# Patient Record
Sex: Female | Born: 1985 | Race: White | Hispanic: No | Marital: Single | Smoking: Current every day smoker
Health system: Southern US, Community
[De-identification: ages and names within clinical notes are randomized; demographics above are authoritative.]

## PROBLEM LIST (undated history)

## (undated) DIAGNOSIS — Z8489 Family history of other specified conditions: Secondary | ICD-10-CM

## (undated) DIAGNOSIS — F1290 Cannabis use, unspecified, uncomplicated: Secondary | ICD-10-CM

## (undated) DIAGNOSIS — I10 Essential (primary) hypertension: Secondary | ICD-10-CM

## (undated) DIAGNOSIS — D649 Anemia, unspecified: Secondary | ICD-10-CM

## (undated) DIAGNOSIS — R030 Elevated blood-pressure reading, without diagnosis of hypertension: Secondary | ICD-10-CM

## (undated) DIAGNOSIS — J45909 Unspecified asthma, uncomplicated: Secondary | ICD-10-CM

## (undated) DIAGNOSIS — R519 Headache, unspecified: Secondary | ICD-10-CM

## (undated) DIAGNOSIS — F17200 Nicotine dependence, unspecified, uncomplicated: Secondary | ICD-10-CM

## (undated) DIAGNOSIS — K219 Gastro-esophageal reflux disease without esophagitis: Secondary | ICD-10-CM

## (undated) DIAGNOSIS — Q430 Meckel's diverticulum (displaced) (hypertrophic): Secondary | ICD-10-CM

## (undated) HISTORY — PX: WISDOM TOOTH EXTRACTION: SHX21

---

## 2006-10-21 ENCOUNTER — Emergency Department: Payer: Self-pay | Admitting: Emergency Medicine

## 2007-10-22 ENCOUNTER — Inpatient Hospital Stay: Payer: Self-pay

## 2008-09-09 ENCOUNTER — Ambulatory Visit: Payer: Self-pay | Admitting: Internal Medicine

## 2011-06-22 ENCOUNTER — Ambulatory Visit: Payer: Self-pay | Admitting: Internal Medicine

## 2014-02-22 ENCOUNTER — Ambulatory Visit: Payer: Self-pay

## 2014-02-22 LAB — COMPREHENSIVE METABOLIC PANEL
ALBUMIN: 3.7 g/dL (ref 3.4–5.0)
AST: 14 U/L — AB (ref 15–37)
Alkaline Phosphatase: 110 U/L
Anion Gap: 12 (ref 7–16)
BILIRUBIN TOTAL: 0.2 mg/dL (ref 0.2–1.0)
BUN: 7 mg/dL (ref 7–18)
CALCIUM: 8.9 mg/dL (ref 8.5–10.1)
Chloride: 106 mmol/L (ref 98–107)
Co2: 25 mmol/L (ref 21–32)
Creatinine: 0.83 mg/dL (ref 0.60–1.30)
EGFR (Non-African Amer.): >60
Glucose: 89 mg/dL (ref 65–99)
Osmolality: 282 (ref 275–301)
Potassium: 3.6 mmol/L (ref 3.5–5.1)
SGPT (ALT): 23 U/L (ref 12–78)
Sodium: 143 mmol/L (ref 136–145)
Total Protein: 7.3 g/dL (ref 6.4–8.2)

## 2014-02-22 LAB — CBC WITH DIFFERENTIAL/PLATELET
BASOS ABS: 0.1 10*3/uL (ref 0.0–0.1)
Basophil %: 1.3 %
EOS ABS: 0.1 10*3/uL (ref 0.0–0.7)
Eosinophil %: 0.9 %
HCT: 34.2 % — ABNORMAL LOW (ref 35.0–47.0)
HGB: 10.4 g/dL — AB (ref 12.0–16.0)
LYMPHS ABS: 2.1 10*3/uL (ref 1.0–3.6)
Lymphocyte %: 20.9 %
MCH: 22.4 pg — ABNORMAL LOW (ref 26.0–34.0)
MCHC: 30.3 g/dL — AB (ref 32.0–36.0)
MCV: 74 fL — ABNORMAL LOW (ref 80–100)
Monocyte #: 0.4 x10 3/mm (ref 0.2–0.9)
Monocyte %: 4.2 %
NEUTROS ABS: 7.2 10*3/uL — AB (ref 1.4–6.5)
Neutrophil %: 72.7 %
Platelet: 630 10*3/uL — ABNORMAL HIGH (ref 150–440)
RBC: 4.62 10*6/uL (ref 3.80–5.20)
RDW: 17.6 % — ABNORMAL HIGH (ref 11.5–14.5)
WBC: 9.9 10*3/uL (ref 3.6–11.0)

## 2014-02-22 LAB — URINALYSIS, COMPLETE
BACTERIA: NEGATIVE
Bilirubin,UR: NEGATIVE
GLUCOSE, UR: NEGATIVE mg/dL (ref 0–75)
KETONE: NEGATIVE
Nitrite: NEGATIVE
Ph: 6 (ref 4.5–8.0)
Protein: NEGATIVE
Specific Gravity: 1.01 (ref 1.003–1.030)

## 2014-02-22 LAB — LIPASE, BLOOD: Lipase: 118 U/L (ref 73–393)

## 2014-02-22 LAB — PREGNANCY, URINE: Pregnancy Test, Urine: NEGATIVE m[IU]/mL

## 2014-02-22 LAB — AMYLASE: Amylase: 32 U/L (ref 25–115)

## 2014-02-25 ENCOUNTER — Ambulatory Visit: Payer: Self-pay

## 2014-02-25 LAB — URINE CULTURE

## 2015-02-11 ENCOUNTER — Emergency Department: Payer: Medicaid Other

## 2015-02-11 ENCOUNTER — Emergency Department
Admission: EM | Admit: 2015-02-11 | Discharge: 2015-02-11 | Disposition: A | Discharge status: Home / Self Care | Payer: Medicaid Other | Attending: Emergency Medicine | Admitting: Emergency Medicine

## 2015-02-11 ENCOUNTER — Encounter: Payer: Self-pay | Admitting: Emergency Medicine

## 2015-02-11 DIAGNOSIS — I10 Essential (primary) hypertension: Secondary | ICD-10-CM | POA: Insufficient documentation

## 2015-02-11 DIAGNOSIS — Z720 Tobacco use: Secondary | ICD-10-CM | POA: Insufficient documentation

## 2015-02-11 DIAGNOSIS — R079 Chest pain, unspecified: Secondary | ICD-10-CM | POA: Insufficient documentation

## 2015-02-11 DIAGNOSIS — K224 Dyskinesia of esophagus: Secondary | ICD-10-CM | POA: Diagnosis not present

## 2015-02-11 HISTORY — DX: Unspecified asthma, uncomplicated: J45.909

## 2015-02-11 HISTORY — DX: Essential (primary) hypertension: I10

## 2015-02-11 LAB — BASIC METABOLIC PANEL
ANION GAP: 5 (ref 5–15)
BUN: 9 mg/dL (ref 6–20)
CALCIUM: 9.1 mg/dL (ref 8.9–10.3)
CO2: 28 mmol/L (ref 22–32)
CREATININE: 0.71 mg/dL (ref 0.44–1.00)
Chloride: 107 mmol/L (ref 101–111)
GFR calc Af Amer: >60 mL/min (ref >60)
GFR calc non Af Amer: >60 mL/min (ref >60)
Glucose, Bld: 89 mg/dL (ref 65–99)
Potassium: 3.7 mmol/L (ref 3.5–5.1)
Sodium: 140 mmol/L (ref 135–145)

## 2015-02-11 LAB — CBC
HEMATOCRIT: 29.9 % — AB (ref 35.0–47.0)
HEMOGLOBIN: 9.1 g/dL — AB (ref 12.0–16.0)
MCH: 21.3 pg — ABNORMAL LOW (ref 26.0–34.0)
MCHC: 30.3 g/dL — ABNORMAL LOW (ref 32.0–36.0)
MCV: 70.4 fL — ABNORMAL LOW (ref 80.0–100.0)
Platelets: 572 10*3/uL — ABNORMAL HIGH (ref 150–440)
RBC: 4.25 MIL/uL (ref 3.80–5.20)
RDW: 17.5 % — ABNORMAL HIGH (ref 11.5–14.5)
WBC: 7.7 10*3/uL (ref 3.6–11.0)

## 2015-02-11 LAB — TROPONIN I

## 2015-02-11 MED ORDER — DICYCLOMINE HCL 20 MG PO TABS
ORAL_TABLET | ORAL | Status: AC
  Administered 2015-02-11: 20 mg via ORAL
  Filled 2015-02-11: qty 1

## 2015-02-11 MED ORDER — DICYCLOMINE HCL 20 MG PO TABS: 20.0000 mg | ORAL_TABLET | Freq: Four times a day (QID) | ORAL | Status: DC | PRN

## 2015-02-11 MED ORDER — DICYCLOMINE HCL 20 MG PO TABS
20.0000 mg | ORAL_TABLET | Freq: Once | ORAL | Status: AC
  Administered 2015-02-11: 20 mg via ORAL

## 2015-02-11 NOTE — ED Notes (Signed)
Pt reports that she has had mid sternal chest pain that has been coming and going for the last 4 days. She states that when she eats or takes a deep breath in she feels a catch. She denies any SOB, diaphoresis or N/V

## 2015-02-11 NOTE — Discharge Instructions (Signed)
1. Take Bentyl as needed for discomfort (#20). 2. Return to the ER for worsening symptoms, persistent vomiting, difficulty breathing or other concerns.  Chest Pain (Nonspecific) It is often hard to give a specific diagnosis for the cause of chest pain. There is always a chance that your pain could be related to something serious, such as a heart attack or a blood clot in the lungs. You need to follow up with your health care provider for further evaluation. CAUSES   Heartburn.  Pneumonia or bronchitis.  Anxiety or stress.  Inflammation around your heart (pericarditis) or lung (pleuritis or pleurisy).  A blood clot in the lung.  A collapsed lung (pneumothorax). It can develop suddenly on its own (spontaneous pneumothorax) or from trauma to the chest.  Shingles infection (herpes zoster virus). The chest wall is composed of bones, muscles, and cartilage. Any of these can be the source of the pain.  The bones can be bruised by injury.  The muscles or cartilage can be strained by coughing or overwork.  The cartilage can be affected by inflammation and become sore (costochondritis). DIAGNOSIS  Lab tests or other studies may be needed to find the cause of your pain. Your health care provider may have you take a test called an ambulatory electrocardiogram (ECG). An ECG records your heartbeat patterns over a 24-hour period. You may also have other tests, such as:  Transthoracic echocardiogram (TTE). During echocardiography, sound waves are used to evaluate how blood flows through your heart.  Transesophageal echocardiogram (TEE).  Cardiac monitoring. This allows your health care provider to monitor your heart rate and rhythm in real time.  Holter monitor. This is a portable device that records your heartbeat and can help diagnose heart arrhythmias. It allows your health care provider to track your heart activity for several days, if needed.  Stress tests by exercise or by giving medicine  that makes the heart beat faster. TREATMENT   Treatment depends on what may be causing your chest pain. Treatment may include:  Acid blockers for heartburn.  Anti-inflammatory medicine.  Pain medicine for inflammatory conditions.  Antibiotics if an infection is present.  You may be advised to change lifestyle habits. This includes stopping smoking and avoiding alcohol, caffeine, and chocolate.  You may be advised to keep your head raised (elevated) when sleeping. This reduces the chance of acid going backward from your stomach into your esophagus. Most of the time, nonspecific chest pain will improve within 2-3 days with rest and mild pain medicine.  HOME CARE INSTRUCTIONS   If antibiotics were prescribed, take them as directed. Finish them even if you start to feel better.  For the next few days, avoid physical activities that bring on chest pain. Continue physical activities as directed.  Do not use any tobacco products, including cigarettes, chewing tobacco, or electronic cigarettes.  Avoid drinking alcohol.  Only take medicine as directed by your health care provider.  Follow your health care provider's suggestions for further testing if your chest pain does not go away.  Keep any follow-up appointments you made. If you do not go to an appointment, you could develop lasting (chronic) problems with pain. If there is any problem keeping an appointment, call to reschedule. SEEK MEDICAL CARE IF:   Your chest pain does not go away, even after treatment.  You have a rash with blisters on your chest.  You have a fever. SEEK IMMEDIATE MEDICAL CARE IF:   You have increased chest pain or pain that  spreads to your arm, neck, jaw, back, or abdomen.  You have shortness of breath.  You have an increasing cough, or you cough up blood.  You have severe back or abdominal pain.  You feel nauseous or vomit.  You have severe weakness.  You faint.  You have chills. This is an  emergency. Do not wait to see if the pain will go away. Get medical help at once. Call your local emergency services (911 in [place] Do not drive yourself to the hospital. MAKE SURE YOU:   Understand these instructions.  Will watch your condition.  Will get help right away if you are not doing well or get worse. Document Released: 05/30/2005 Document Revised: 08/25/2013 Document Reviewed: 03/25/2008 [facility] Patient Information 2015 [place], [place]. This information is not intended to replace advice given to you by your health care provider. Make sure you discuss any questions you have with your health care provider.  Esophageal Spasm Esophageal spasm is an uncoordinated contraction of the muscles of the esophagus (the tube which carries food from your mouth to your stomach). Normally, the muscles of the esophagus alternate between contraction and relaxation starting from the top of the esophagus and working down to the bottom. This moves the food from the mouth to the stomach. In esophageal spasm, all the muscles contract at once. This causes pain and fails to move the food along. As a result, you may have trouble swallowing.  Women are more likely than men to have esophageal spasm. The cause of the spasms is not known. Sometimes eating hot or cold foods triggers the condition and this may be due to an overly sensitive esophagus. This is not an infectious disease and cannot be passed to others. SYMPTOMS  Symptoms of esophageal spasm may include: chest pain, burning or pain with swallowing, and difficulty swallowing.  DIAGNOSIS  Esophageal spasm can be diagnosed by a test called manometry (pressure studies of the esophagus). In this test, a special tube is inserted down the esophagus. The tube measures the muscle activity of the esophagus. Abnormal contractions mixed with normal movement helps confirm the diagnosis.  A person with a hypersensitive esophagus may be diagnosed by inflating a long  balloon in the person's esophagus. If this causes the same symptoms, preventive methods may work. PREVENTION  Avoid hot or cold foods if that seems to be a trigger. PROGNOSIS  This condition does not go away, nor is treatment entirely satisfactory. Patients need to be careful of what they eat. They need to continue on medication if a useful one is found. Fortunately, the condition does not get progressively worse as time passes. Esophageal spasm does not usually lead to more serious problems but sometimes the pain can be disabling. If a person becomes afraid to eat they may become malnourished and lose weight.  TREATMENT   A procedure in which instruments of increasing size are inserted through the esophagus to enlarge (dilate) it are used.  Medications that decrease acid-production of the stomach may be used such as proton-pump inhibitors or H2-blockers.  Medications of several types can be used to relax the muscles of the esophagus.  An individual with a hypersensitive esophagus sometimes improves with low doses of medications normally used for depression.  No treatment for esophageal spasm is effective for everyone. Often several approaches will be tried before one works. In many cases, the symptoms will improve, but will not go away completely.  For severe cases, relief is obtained two-thirds of the time by  cutting the muscles along the entire length of the esophagus. This is a major surgical procedure.  Your symptoms are usually the best guide to how well the treatment for esophageal spasm works. SIDE EFFECTS OF TREATMENTS  Nitrates can cause headaches and low blood pressure.  Calcium channel blockers can cause:  Feeling sick to your stomach (nausea).  Constipation and other side effects.  Antidepressants can cause side effects that depend on the medication used. HOME CARE INSTRUCTIONS   Let your caregiver know if problems are getting worse, or if you get food stuck in your  esophagus for longer than 1 hour or as directed and are unable to swallow liquid.  Take medications as directed and with permission of your caregiver. Ask about what to do if a medication seems to get stuck in your esophagus. Only take over-the-counter or prescription medicines for pain, discomfort, or fever as directed by your caregiver.  Soft and liquid foods pass more easily than solid pieces. SEEK IMMEDIATE MEDICAL CARE IF:   You develop severe chest pain, especially if the pain is crushing or pressure-like and spreads to the arms, back, neck, or jaw, or if you have sweating, nausea, or shortness of breath. THIS COULD BE AN EMERGENCY. Do not wait to see if the pain will go away. Get medical help at once. Call 911 or 0 (operator). DO NOT drive yourself to the hospital.  Your chest pain gets worse and does not go away with rest.  You have an attack of chest pain lasting longer than usual despite rest and treatment with the medications your physician has prescribed.  You wake from sleep with chest pain or shortness of breath.  You feel dizzy or faint.  You have chest pain, not typical of your usual pain, caused by your esophagus for which you originally saw your caregiver. MAKE SURE YOU:   Understand these instructions.  Will watch your condition.  Will get help right away if you are not doing well or get worse. Document Released: 11/10/2002 Document Revised: 11/12/2011 Document Reviewed: 11/13/2013 [facility] Patient Information 2015 [place], [place]. This information is not intended to replace advice given to you by your health care provider. Make sure you discuss any questions you have with your health care provider.

## 2015-02-11 NOTE — ED Notes (Signed)
Patient with no complaints at this time. Respirations even and unlabored. Skin warm/dry. Discharge instructions reviewed with patient at this time. Patient given opportunity to voice concerns/ask questions. Patient discharged at this time and left Emergency Department with steady gait.   

## 2015-02-11 NOTE — ED Provider Notes (Signed)
[facility] Emergency Department Provider Note  ____________________________________________  Time seen: Approximately 4:53 AM  I have reviewed the triage vital signs and the nursing notes.   HISTORY  Chief Complaint Chest Pain    HPI Kelli Morrison is a 29 y.o. female presents with a 4 day history of midsternal chest pain associated only with eating. Patient states she experiences similar symptoms with both solids and liquids. Describes swallowing food or liquid, feeling it "stick" in the middle of her chest, then sharp pain for a few seconds until the feeling subsides. States sometimes the feeling feels like burning but mostly just feels like pain. Patient does not have this pain outside of eating.Patient denies fever, chills, cough, shortness of breath, nausea, diaphoresis, headache, weakness.   Past Medical History  Diagnosis Date  . Hypertension   . Asthma     There are no active problems to display for this patient.   Past Surgical History  Procedure Laterality Date  . Wisdom tooth extraction    . Cesarean section      No current outpatient prescriptions on file.  Allergies Review of patient's allergies indicates no known allergies.  History reviewed. No pertinent family history.  Social History History  Substance Use Topics  . Smoking status: Current Every Day Smoker  . Smokeless tobacco: Not on file  . Alcohol Use: No    Review of Systems Constitutional: No fever/chills Eyes: No visual changes. ENT: No sore throat. Cardiovascular: Positive for chest pain. Respiratory: Denies shortness of breath. Gastrointestinal: No abdominal pain.  No nausea, no vomiting.  No diarrhea.  No constipation. Genitourinary: Negative for dysuria. Musculoskeletal: Negative for back pain. Skin: Negative for rash. Neurological: Negative for headaches, focal weakness or numbness.  10-point ROS otherwise  negative.  ____________________________________________   PHYSICAL EXAM:  VITAL SIGNS: ED Triage Vitals  Enc Vitals Group     BP 02/11/15 0109 143/90 mmHg     Pulse Rate 02/11/15 0109 90     Resp 02/11/15 0109 18     Temp 02/11/15 0109 97.6 F (36.4 C)     Temp Source 02/11/15 0109 Oral     SpO2 02/11/15 0109 100 %     Weight 02/11/15 0109 185 lb (83.915 kg)     Height 02/11/15 0109 5\' 7"  (1.702 m)     Head Cir --      Peak Flow --      Pain Score 02/11/15 0109 8     Pain Loc --      Pain Edu? --      Excl. in [place]? --     Constitutional: Alert and oriented. Well appearing and in no acute distress. Eyes: Conjunctivae are normal. PERRL. EOMI. Head: Atraumatic. Nose: No congestion/rhinnorhea. Mouth/Throat: Mucous membranes are moist.  Oropharynx non-erythematous. Neck: No stridor.   Cardiovascular: Normal rate, regular rhythm. Grossly normal heart sounds.  Good peripheral circulation. Respiratory: Normal respiratory effort.  No retractions. Lungs CTAB. Gastrointestinal: Soft and nontender. No distention. No abdominal bruits. No CVA tenderness. Musculoskeletal: No lower extremity tenderness nor edema.  No joint effusions. Neurologic:  Normal speech and language. No gross focal neurologic deficits are appreciated. Speech is normal. No gait instability. Skin:  Skin is warm, dry and intact. No rash noted. Psychiatric: Mood and affect are normal. Speech and behavior are normal.  ____________________________________________   LABS (all labs ordered are listed, but only abnormal results are displayed)  Labs Reviewed  CBC - Abnormal; Notable for the following:  Hemoglobin 9.1 (*)    HCT 29.9 (*)    MCV 70.4 (*)    MCH 21.3 (*)    MCHC 30.3 (*)    RDW 17.5 (*)    Platelets 572 (*)    All other components within normal limits  BASIC METABOLIC PANEL  TROPONIN I   ____________________________________________  EKG  ED ECG REPORT I, Kama Cammarano J, the attending  physician, personally viewed and interpreted this ECG.   Date: 02/11/2015  EKG Time: 0116  Rate: 78  Rhythm: normal EKG, normal sinus rhythm  Axis: Normal  Intervals:none  ST&T Change: Nonspecific  ____________________________________________  RADIOLOGY  Chest 2 view (view by me, interpreted by Dr. Alroy Dust): No active cardiopulmonary disease. ____________________________________________   PROCEDURES  Procedure(s) performed: None  Critical Care performed: No  ____________________________________________   INITIAL IMPRESSION / ASSESSMENT AND PLAN / ED COURSE  Pertinent labs & imaging results that were available during my care of the patient were reviewed by me and considered in my medical decision making (see chart for details).  29 year old female who presents with a 4 day history of pain in center of chest associated with eating solids and liquids. No recent travel history or oral contraception use to suggest pulmonary embolism. No suspicion for acute coronary syndrome. Symptoms more likely consistent with esophageal spasms. Discussed with patient; do not feel comfortable starting a young patient on nitroglycerin for esophageal spasms, will instead start Bentyl for anti-spasmodic and refer to GI for follow-up. Strict return precautions given. Patient verbalizes understanding and agrees with plan of care. ____________________________________________   FINAL CLINICAL IMPRESSION(S) / ED DIAGNOSES  Final diagnoses:  Chest pain, unspecified chest pain type  Esophageal spasm      Paulette Blanch, MD 02/11/15 [phone]

## 2016-12-21 ENCOUNTER — Encounter: Payer: Self-pay | Admitting: Emergency Medicine

## 2016-12-21 ENCOUNTER — Emergency Department: Payer: Medicaid Other

## 2016-12-21 ENCOUNTER — Emergency Department
Admission: EM | Admit: 2016-12-21 | Discharge: 2016-12-21 | Disposition: A | Discharge status: Home / Self Care | Payer: Medicaid Other | Attending: Emergency Medicine | Admitting: Emergency Medicine

## 2016-12-21 DIAGNOSIS — Y999 Unspecified external cause status: Secondary | ICD-10-CM | POA: Diagnosis not present

## 2016-12-21 DIAGNOSIS — J45909 Unspecified asthma, uncomplicated: Secondary | ICD-10-CM | POA: Diagnosis not present

## 2016-12-21 DIAGNOSIS — Y939 Activity, unspecified: Secondary | ICD-10-CM | POA: Diagnosis not present

## 2016-12-21 DIAGNOSIS — Y929 Unspecified place or not applicable: Secondary | ICD-10-CM | POA: Diagnosis not present

## 2016-12-21 DIAGNOSIS — S93402A Sprain of unspecified ligament of left ankle, initial encounter: Secondary | ICD-10-CM | POA: Insufficient documentation

## 2016-12-21 DIAGNOSIS — I10 Essential (primary) hypertension: Secondary | ICD-10-CM | POA: Insufficient documentation

## 2016-12-21 DIAGNOSIS — F17200 Nicotine dependence, unspecified, uncomplicated: Secondary | ICD-10-CM | POA: Diagnosis not present

## 2016-12-21 DIAGNOSIS — S99912A Unspecified injury of left ankle, initial encounter: Secondary | ICD-10-CM | POA: Diagnosis present

## 2016-12-21 DIAGNOSIS — X501XXA Overexertion from prolonged static or awkward postures, initial encounter: Secondary | ICD-10-CM | POA: Insufficient documentation

## 2016-12-21 MED ORDER — TRAMADOL HCL 50 MG PO TABS: 50.0000 mg | ORAL_TABLET | Freq: Two times a day (BID) | ORAL | 0 refills | Status: DC | PRN

## 2016-12-21 MED ORDER — IBUPROFEN 600 MG PO TABS: 600.0000 mg | ORAL_TABLET | Freq: Three times a day (TID) | ORAL | 0 refills | Status: DC | PRN

## 2016-12-21 NOTE — ED Provider Notes (Signed)
[facility] Emergency Department Provider Note   ____________________________________________   First MD Initiated Contact with Patient 12/21/16 1343     (approximate)  I have reviewed the triage vital signs and the nursing notes.   HISTORY  Chief Complaint Ankle Pain    HPI Kelli Morrison is a 31 y.o. female Patient complain of left lateral ankle pain secondary to twisting incident last night.  Rates pain as 5/10. Describes pain as "achy". No palliative measures for compliant.   Past Medical History:  Diagnosis Date  . Asthma   . Hypertension     There are no active problems to display for this patient.   Past Surgical History:  Procedure Laterality Date  . CESAREAN SECTION    . WISDOM TOOTH EXTRACTION      Prior to Admission medications   Medication Sig Start Date End Date Taking? Authorizing Provider  dicyclomine (BENTYL) 20 MG tablet Take 1 tablet (20 mg total) by mouth every 6 (six) hours as needed. 02/11/15   Paulette Blanch, MD  ibuprofen (ADVIL,MOTRIN) 600 MG tablet Take 1 tablet (600 mg total) by mouth every 8 (eight) hours as needed. 12/21/16   Sable Feil, PA-C  traMADol (ULTRAM) 50 MG tablet Take 1 tablet (50 mg total) by mouth every 12 (twelve) hours as needed. 12/21/16   Sable Feil, PA-C    Allergies Patient has no known allergies.  No family history on file.  Social History Social History  Substance Use Topics  . Smoking status: Current Every Day Smoker  . Smokeless tobacco: Not on file  . Alcohol use No    Review of Systems Constitutional: No fever/chills Eyes: No visual changes. ENT: No sore throat. Cardiovascular: Denies chest pain. Respiratory: Denies shortness of breath. Gastrointestinal: No abdominal pain.  No nausea, no vomiting.  No diarrhea.  No constipation. Genitourinary: Negative for dysuria. Musculoskeletal: Left ankle pain Skin: Negative for rash. Neurological: Negative for headaches, focal  weakness or numbness. Endocrine:Hypertension ____________________________________________   PHYSICAL EXAM:  VITAL SIGNS: ED Triage Vitals [12/21/16 1339]  Enc Vitals Group     BP 130/88     Pulse Rate (!) 102     Resp 16     Temp 97.6 F (36.4 C)     Temp Source Oral     SpO2 98 %     Weight 225 lb (102.1 kg)     Height 5\' 7"  (1.702 m)     Head Circumference      Peak Flow      Pain Score 5     Pain Loc      Pain Edu?      Excl. in [place]?     Constitutional: Alert and oriented. Well appearing and in no acute distress. Eyes: Conjunctivae are normal. PERRL. EOMI. Head: Atraumatic. Nose: No congestion/rhinnorhea. Mouth/Throat: Mucous membranes are moist.  Oropharynx non-erythematous. Neck: No stridor.  No cervical spine tenderness to palpation. Hematological/Lymphatic/Immunilogical: No cervical lymphadenopathy. Cardiovascular: Normal rate, regular rhythm. Grossly normal heart sounds.  Good peripheral circulation. Respiratory: Normal respiratory effort.  No retractions. Lungs CTAB. Gastrointestinal: Soft and nontender. No distention. No abdominal bruits. No CVA tenderness. Musculoskeletal: No obvious deformity. Moderate edema lateral mallelous. Neurologic:  Normal speech and language. No gross focal neurologic deficits are appreciated. No gait instability. Skin:  Skin is warm, dry and intact. No rash noted. Psychiatric: Mood and affect are normal. Speech and behavior are normal.  ____________________________________________   LABS (all labs ordered are  listed, but only abnormal results are displayed)  Labs Reviewed - No data to display ____________________________________________  EKG   ____________________________________________  RADIOLOGY   ____________________________________________   PROCEDURES  Procedure(s) performed: None  Procedures  Critical Care performed: No  ____________________________________________   INITIAL IMPRESSION / ASSESSMENT  AND PLAN / ED COURSE  Pertinent labs & imaging results that were available during my care of the patient were reviewed by me and considered in my medical decision making (see chart for details).  Left ankle sprain. X-ray pending.      ____________________________________________   FINAL CLINICAL IMPRESSION(S) / ED DIAGNOSES  Final diagnoses:  Sprain of left ankle, unspecified ligament, initial encounter   Patient given ankle splint and discharge instruction. Advised to follow up with Doctor Cleda Mccreedy  if compliant do not improved.   NEW MEDICATIONS STARTED DURING THIS VISIT:  New Prescriptions   IBUPROFEN (ADVIL,MOTRIN) 600 MG TABLET    Take 1 tablet (600 mg total) by mouth every 8 (eight) hours as needed.   TRAMADOL (ULTRAM) 50 MG TABLET    Take 1 tablet (50 mg total) by mouth every 12 (twelve) hours as needed.     Note:  This document was prepared using Dragon voice recognition software and may include unintentional dictation errors.    Sable Feil, PA-C 12/21/16 1414    Earleen Newport, MD 12/21/16 [phone]

## 2016-12-21 NOTE — ED Triage Notes (Signed)
Patient presents to ED via POV with c/o left ankle pain after rolling it last night.

## 2017-02-11 ENCOUNTER — Encounter: Payer: Self-pay | Admitting: Emergency Medicine

## 2017-02-11 DIAGNOSIS — F17200 Nicotine dependence, unspecified, uncomplicated: Secondary | ICD-10-CM | POA: Insufficient documentation

## 2017-02-11 DIAGNOSIS — N390 Urinary tract infection, site not specified: Secondary | ICD-10-CM | POA: Diagnosis not present

## 2017-02-11 DIAGNOSIS — Z79899 Other long term (current) drug therapy: Secondary | ICD-10-CM | POA: Diagnosis not present

## 2017-02-11 DIAGNOSIS — I10 Essential (primary) hypertension: Secondary | ICD-10-CM | POA: Diagnosis not present

## 2017-02-11 DIAGNOSIS — Z791 Long term (current) use of non-steroidal anti-inflammatories (NSAID): Secondary | ICD-10-CM | POA: Insufficient documentation

## 2017-02-11 DIAGNOSIS — N10 Acute tubulo-interstitial nephritis: Secondary | ICD-10-CM | POA: Diagnosis not present

## 2017-02-11 DIAGNOSIS — R109 Unspecified abdominal pain: Secondary | ICD-10-CM | POA: Diagnosis present

## 2017-02-11 DIAGNOSIS — J45909 Unspecified asthma, uncomplicated: Secondary | ICD-10-CM | POA: Insufficient documentation

## 2017-02-11 LAB — POCT PREGNANCY, URINE: PREG TEST UR: NEGATIVE

## 2017-02-11 NOTE — ED Triage Notes (Signed)
Pt ambulatory to triage with steady gait, no distress noted. Pt c/o of upper epigastric abdominal pain, lower back pain and nasal congestion since Thursday. Pts denies N/V/D.

## 2017-02-12 ENCOUNTER — Emergency Department
Admission: EM | Admit: 2017-02-12 | Discharge: 2017-02-12 | Disposition: A | Discharge status: Home / Self Care | Payer: Medicaid Other | Attending: Emergency Medicine | Admitting: Emergency Medicine

## 2017-02-12 ENCOUNTER — Encounter: Payer: Self-pay | Admitting: Radiology

## 2017-02-12 ENCOUNTER — Emergency Department: Payer: Medicaid Other

## 2017-02-12 DIAGNOSIS — N12 Tubulo-interstitial nephritis, not specified as acute or chronic: Secondary | ICD-10-CM

## 2017-02-12 DIAGNOSIS — N390 Urinary tract infection, site not specified: Secondary | ICD-10-CM

## 2017-02-12 DIAGNOSIS — R1084 Generalized abdominal pain: Secondary | ICD-10-CM

## 2017-02-12 LAB — URINALYSIS, COMPLETE (UACMP) WITH MICROSCOPIC
Bilirubin Urine: NEGATIVE
Glucose, UA: NEGATIVE mg/dL
Ketones, ur: NEGATIVE mg/dL
Nitrite: NEGATIVE
PH: 5 (ref 5.0–8.0)
Protein, ur: NEGATIVE mg/dL
SPECIFIC GRAVITY, URINE: 1.019 (ref 1.005–1.030)

## 2017-02-12 LAB — CBC
HCT: 40.6 % (ref 35.0–47.0)
Hemoglobin: 13.9 g/dL (ref 12.0–16.0)
MCH: 32.5 pg (ref 26.0–34.0)
MCHC: 34.2 g/dL (ref 32.0–36.0)
MCV: 95.2 fL (ref 80.0–100.0)
PLATELETS: 310 10*3/uL (ref 150–440)
RBC: 4.27 MIL/uL (ref 3.80–5.20)
RDW: 13.6 % (ref 11.5–14.5)
WBC: 15.1 10*3/uL — ABNORMAL HIGH (ref 3.6–11.0)

## 2017-02-12 LAB — COMPREHENSIVE METABOLIC PANEL
ALT: 19 U/L (ref 14–54)
AST: 24 U/L (ref 15–41)
Albumin: 3.7 g/dL (ref 3.5–5.0)
Alkaline Phosphatase: 107 U/L (ref 38–126)
Anion gap: 8 (ref 5–15)
BUN: 11 mg/dL (ref 6–20)
CHLORIDE: 105 mmol/L (ref 101–111)
CO2: 27 mmol/L (ref 22–32)
Calcium: 8.8 mg/dL — ABNORMAL LOW (ref 8.9–10.3)
Creatinine, Ser: 0.79 mg/dL (ref 0.44–1.00)
GFR calc Af Amer: >60 mL/min (ref >60)
GFR calc non Af Amer: >60 mL/min (ref >60)
Glucose, Bld: 84 mg/dL (ref 65–99)
Potassium: 3.8 mmol/L (ref 3.5–5.1)
SODIUM: 140 mmol/L (ref 135–145)
Total Bilirubin: 0.3 mg/dL (ref 0.3–1.2)
Total Protein: 7.1 g/dL (ref 6.5–8.1)

## 2017-02-12 LAB — LIPASE, BLOOD: LIPASE: 19 U/L (ref 11–51)

## 2017-02-12 LAB — TROPONIN I: Troponin I: <0.03 ng/mL (ref <0.03)

## 2017-02-12 MED ORDER — CEPHALEXIN 500 MG PO CAPS: 500.0000 mg | ORAL_CAPSULE | Freq: Three times a day (TID) | ORAL | 0 refills | Status: DC

## 2017-02-12 MED ORDER — HYDROCODONE-ACETAMINOPHEN 5-325 MG PO TABS: 1.0000 | ORAL_TABLET | Freq: Four times a day (QID) | ORAL | 0 refills | Status: DC | PRN

## 2017-02-12 MED ORDER — LIDOCAINE HCL (PF) 1 % IJ SOLN
2.0000 mL | Freq: Once | INTRAMUSCULAR | Status: AC
  Administered 2017-02-12: 2 mL

## 2017-02-12 MED ORDER — DEXTROSE 5 % IV SOLN: 1.0000 g | INTRAVENOUS | Status: DC

## 2017-02-12 MED ORDER — IOPAMIDOL (ISOVUE-300) INJECTION 61%
100.0000 mL | Freq: Once | INTRAVENOUS | Status: AC | PRN
  Administered 2017-02-12: 100 mL via INTRAVENOUS

## 2017-02-12 MED ORDER — CEFTRIAXONE SODIUM 1 G IJ SOLR
1.0000 g | Freq: Once | INTRAMUSCULAR | Status: AC
  Administered 2017-02-12: 1 g via INTRAMUSCULAR
  Filled 2017-02-12: qty 10

## 2017-02-12 MED ORDER — LIDOCAINE HCL (PF) 1 % IJ SOLN
INTRAMUSCULAR | Status: AC
  Administered 2017-02-12: 2 mL
  Filled 2017-02-12: qty 5

## 2017-02-12 MED ORDER — SODIUM CHLORIDE 0.9 % IV BOLUS (SEPSIS)
500.0000 mL | Freq: Once | INTRAVENOUS | Status: AC
  Administered 2017-02-12: 500 mL via INTRAVENOUS

## 2017-02-12 MED ORDER — IOPAMIDOL (ISOVUE-300) INJECTION 61%
30.0000 mL | Freq: Once | INTRAVENOUS | Status: AC | PRN
  Administered 2017-02-12: 30 mL via ORAL

## 2017-02-12 MED ORDER — ONDANSETRON 4 MG PO TBDP: 4.0000 mg | ORAL_TABLET | Freq: Three times a day (TID) | ORAL | 0 refills | Status: DC | PRN

## 2017-02-12 MED ORDER — ONDANSETRON HCL 4 MG/2ML IJ SOLN
4.0000 mg | Freq: Once | INTRAMUSCULAR | Status: AC
  Administered 2017-02-12: 4 mg via INTRAVENOUS
  Filled 2017-02-12: qty 2

## 2017-02-12 NOTE — ED Provider Notes (Signed)
[facility] Provider Note   ____________________________________________   First MD Initiated Contact with Patient 02/12/17 0211     (approximate)  I have reviewed the triage vital signs and the nursing notes.   HISTORY  Chief Complaint Abdominal Pain; Nasal Congestion; and Back Pain    HPI Kelli Morrison is a 31 y.o. female who presents to the ED from home with a chief complaint of abdominal pain, lower back pain and nasal congestion. Patient reports onset of lower back pain 4 days ago in her sleep, right greater than left. 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 Has also noted some nasal congestion.Main chief complaint is abdominal pain which started yesterday. Initially started in upper abdomen, now diffusely. Symptoms associated with nausea; no vomiting or diarrhea. Last bowel movement this morning which was normal for patient. Denies associated fever, chills, chest pain, shortness of breath, dysuria. Had her period over the weekend which lasted only 2 days which is shorter than usual. Denies vaginal discharge. Denies recent travel, trauma, camping or antibiotic use. Nothing makes her symptoms better or worse.   Past Medical History:  Diagnosis Date  . Asthma   . Hypertension     There are no active problems to display for this patient.   Past Surgical History:  Procedure Laterality Date  . CESAREAN SECTION    . WISDOM TOOTH EXTRACTION      Prior to Admission medications   Medication Sig Start Date End Date Taking? Authorizing Provider  cephALEXin (KEFLEX) 500 MG capsule Take 1 capsule (500 mg total) by mouth 3 (three) times daily. 02/12/17   Paulette Blanch, MD  dicyclomine (BENTYL) 20 MG tablet Take 1 tablet (20 mg total) by mouth every 6 (six) hours as needed. 02/11/15   Paulette Blanch, MD  HYDROcodone-acetaminophen (NORCO) 5-325 MG tablet Take 1 tablet by mouth every 6  (six) hours as needed for moderate pain. 02/12/17   Paulette Blanch, MD  ibuprofen (ADVIL,MOTRIN) 600 MG tablet Take 1 tablet (600 mg total) by mouth every 8 (eight) hours as needed. 12/21/16   Sable Feil, PA-C  ondansetron (ZOFRAN ODT) 4 MG disintegrating tablet Take 1 tablet (4 mg total) by mouth every 8 (eight) hours as needed for nausea or vomiting. 02/12/17   Paulette Blanch, MD  traMADol (ULTRAM) 50 MG tablet Take 1 tablet (50 mg total) by mouth every 12 (twelve) hours as needed. 12/21/16   Sable Feil, PA-C    Allergies Patient has no known allergies.  History reviewed. No pertinent family history.  Social History Social History  Substance Use Topics  . Smoking status: Current Every Day Smoker  . Smokeless tobacco: Never Used  . Alcohol use No    Review of Systems  Constitutional: No fever/chills. Eyes: No visual changes. ENT: Positive for nasal congestion. No sore throat. Cardiovascular: Denies chest pain. Respiratory: Denies shortness of breath. Gastrointestinal: Positive for abdominal pain and nausea, no vomiting.  No diarrhea.  No constipation. Genitourinary: Negative for dysuria. Musculoskeletal: Positive for back pain. Skin: Negative for rash. Neurological: Negative for headaches, focal weakness or numbness.   ____________________________________________   PHYSICAL EXAM:  VITAL SIGNS: ED Triage Vitals [02/11/17 2353]  Enc Vitals Group     BP 125/81     Pulse Rate 100     Resp 17     Temp 98.6 F (37 C)     Temp  Source Oral     SpO2 100 %     Weight      Height      Head Circumference      Peak Flow      Pain Score      Pain Loc      Pain Edu?      Excl. in [place]?     Constitutional: Alert and oriented. Well appearing and in no acute distress. Eyes: Conjunctivae are normal.  Head: Atraumatic. Nose: No congestion/rhinnorhea. Mouth/Throat: Mucous membranes are moist.  Oropharynx non-erythematous. Neck: No stridor.   Cardiovascular: Normal rate,  regular rhythm. Grossly normal heart sounds.  Good peripheral circulation. Respiratory: Normal respiratory effort.  No retractions. Lungs CTAB. Gastrointestinal: Soft with mild tenderness to palpation diffusely without rebound or guarding. No distention. No abdominal bruits. No CVA tenderness. Musculoskeletal: No lower extremity tenderness nor edema.  No joint effusions. Neurologic:  Normal speech and language. No gross focal neurologic deficits are appreciated. No gait instability. Skin:  Skin is warm, dry and intact. No rash noted. Psychiatric: Mood and affect are normal. Speech and behavior are normal.  ____________________________________________   LABS (all labs ordered are listed, but only abnormal results are displayed)  Labs Reviewed  COMPREHENSIVE METABOLIC PANEL - Abnormal; Notable for the following:       Result Value   Calcium 8.8 (*)    All other components within normal limits  CBC - Abnormal; Notable for the following:    WBC 15.1 (*)    All other components within normal limits  URINALYSIS, COMPLETE (UACMP) WITH MICROSCOPIC - Abnormal; Notable for the following:    Color, Urine YELLOW (*)    APPearance HAZY (*)    Hgb urine dipstick MODERATE (*)    Leukocytes, UA TRACE (*)    Bacteria, UA RARE (*)    Squamous Epithelial / LPF 0-5 (*)    All other components within normal limits  LIPASE, BLOOD  TROPONIN I  POCT PREGNANCY, URINE  POC URINE PREG, ED   ____________________________________________  EKG  None ____________________________________________  RADIOLOGY  Ct Abdomen Pelvis W Contrast  Result Date: 02/12/2017 CLINICAL DATA:  Upper epigastric abdominal pain EXAM: CT ABDOMEN AND PELVIS WITH CONTRAST TECHNIQUE: Multidetector CT imaging of the abdomen and pelvis was performed using the standard protocol following bolus administration of intravenous contrast. CONTRAST:  12mL ISOVUE-300 IOPAMIDOL (ISOVUE-300) INJECTION 61% COMPARISON:  None. FINDINGS: Lower  chest: Lung bases demonstrate no acute consolidation or effusion. Mild atelectasis at the left lung base. Normal heart size. Hepatobiliary: No focal liver abnormality is seen. No gallstones, gallbladder wall thickening, or biliary dilatation. Pancreas: Unremarkable. No pancreatic ductal dilatation or surrounding inflammatory changes. Spleen: Normal in size without focal abnormality. Adrenals/Urinary Tract: Adrenal glands are within normal limits. Left kidney unremarkable. Hazy edema and soft tissue stranding around the right kidney and renal pelvis. Bladder unremarkable. Stomach/Bowel: Stomach is within normal limits. Appendix appears normal. No evidence of bowel wall thickening, distention, or inflammatory changes. Probable pill fragments in the distal small bowel in the right pelvis. Vascular/Lymphatic: No significant vascular findings are present. No enlarged abdominal or pelvic lymph nodes. Reproductive: Uterus and bilateral adnexa are unremarkable. Other: No free air or free fluid.  Small fat in the umbilicus. Musculoskeletal: No acute or significant osseous findings. IMPRESSION: 1. Mild right perinephric edema and soft tissue stranding with patchy cortical hypodensity in the right midpole ; appearance is suspicious for acute pyelonephritis. Suggest correlation with urinalysis. 2. Normal appendix Electronically Signed  By: Donavan Foil M.D.   On: 02/12/2017 03:12    ____________________________________________   PROCEDURES  Procedure(s) performed: None  Procedures  Critical Care performed: No  ____________________________________________   INITIAL IMPRESSION / ASSESSMENT AND PLAN / ED COURSE  Pertinent labs & imaging results that were available during my care of the patient were reviewed by me and considered in my medical decision making (see chart for details).  31 year old female who presents with lower back pain and generalized abdominal pain. Laboratory urinalysis results remarkable  for mild leukocytosis, trace leukocytes. Will administer antiemetic and proceed to CT abdomen/pelvis to evaluate for intra-abdominal pathology.  Clinical Course as of Feb 13 548  Tue Feb 12, 2017  1610 Patient was updated of CT imaging results. Her IV infiltrated so IM Rocephin was administered. She was discharged home in stable condition with prescriptions for Keflex, Norco and Zofran. She was instructed to follow-up with her PCP closely this week. Strict return precautions were given. Patient verbalized understanding and agreed with plan of care.  [JS]    Clinical Course User Index [JS] Paulette Blanch, MD     ____________________________________________   FINAL CLINICAL IMPRESSION(S) / ED DIAGNOSES  Final diagnoses:  Generalized abdominal pain  Lower urinary tract infectious disease  Pyelonephritis      NEW MEDICATIONS STARTED DURING THIS VISIT:  Discharge Medication List as of 02/12/2017  3:39 AM    START taking these medications   Details  cephALEXin (KEFLEX) 500 MG capsule Take 1 capsule (500 mg total) by mouth 3 (three) times daily., Starting Tue 02/12/2017, Print         Note:  This document was prepared using Dragon voice recognition software and may include unintentional dictation errors.    Paulette Blanch, MD 02/12/17 [phone]

## 2017-02-12 NOTE — ED Notes (Signed)
Gave pt blankets

## 2017-02-12 NOTE — Discharge Instructions (Signed)
1. Take antibiotic as prescribed (Keflex 500mg  three times daily x 7 days). 2. Return to the ER for worsening symptoms, persistent vomiting, difficulty breathing or other concerns.

## 2017-08-10 ENCOUNTER — Encounter: Payer: Self-pay | Admitting: Gynecology

## 2017-08-10 ENCOUNTER — Other Ambulatory Visit: Payer: Self-pay

## 2017-08-10 ENCOUNTER — Ambulatory Visit
Admission: EM | Admit: 2017-08-10 | Discharge: 2017-08-10 | Disposition: A | Discharge status: Home / Self Care | Payer: Medicaid Other | Attending: Emergency Medicine | Admitting: Emergency Medicine

## 2017-08-10 DIAGNOSIS — M546 Pain in thoracic spine: Secondary | ICD-10-CM | POA: Diagnosis not present

## 2017-08-10 MED ORDER — DICLOFENAC SODIUM 75 MG PO TBEC: 75.0000 mg | DELAYED_RELEASE_TABLET | Freq: Two times a day (BID) | ORAL | 0 refills | Status: DC

## 2017-08-10 MED ORDER — METHOCARBAMOL 750 MG PO TABS: 750.0000 mg | ORAL_TABLET | ORAL | 0 refills | Status: DC

## 2017-08-10 MED ORDER — IBUPROFEN 600 MG PO TABS: 600.0000 mg | ORAL_TABLET | Freq: Four times a day (QID) | ORAL | 0 refills | Status: DC | PRN

## 2017-08-10 NOTE — ED Triage Notes (Signed)
Patient c/o right upper back pain x 1 week ago and cough on and off x 1 month. Per patient her is much better.

## 2017-08-10 NOTE — Discharge Instructions (Signed)
Stop the ibuprofen.  Take the diclofenac with 1 g of Tylenol twice a day.  May take an additional gram of Tylenol 2 more times a day.  Try some deep tissue massage.  The Robaxin is a muscle relaxant and should help as well.

## 2017-08-10 NOTE — ED Provider Notes (Signed)
HPI  SUBJECTIVE:  Kelli Morrison is a 31 y.o. female who presents with upper thoracic right-sided back pain for the past week.  She states that it became worse last night, is now constant, stabbing.  States that she has been doing a lot of coughing for the past month.  She reports greenish nasal congestion, rhinorrhea, postnasal drip.  She tried 5 pills of her friend's amoxicillin earlier this week, and has been taking ibuprofen 600 mg 3-4 times a day.  No alleviating factors.  Symptoms are worse with inspiration, torso rotation, bending forward, moving her right arm and coughing.  No sinus pain or pressure, dental pain, fevers.  No rash.  No other chest pain, shortness of breath, hemoptysis.  No wheezing. no abdominal pain, nausea, vomiting.  No lower extremity edema, calf pain, exogenous estrogen, immobilization, surgery in the past 4 weeks, recent trauma.  She is right-handed.  She denies change in her physical activity. Medical history of asthma.  She is a smoker.  No history of PE, DVT, pneumonia, gallbladder disease, diabetes, hypertension, hypercoagulability.  LMP: 11/10.  Denies possibility of being pregnant.  PMD: None.   Past Medical History:  Diagnosis Date  . Asthma   . Hypertension     Past Surgical History:  Procedure Laterality Date  . CESAREAN SECTION    . WISDOM TOOTH EXTRACTION      No family history on file.  Social History   Tobacco Use  . Smoking status: Current Every Day Smoker  . Smokeless tobacco: Never Used  Substance Use Topics  . Alcohol use: No  . Drug use: No    No current facility-administered medications for this encounter.   Current Outpatient Medications:  .  diclofenac (VOLTAREN) 75 MG EC tablet, Take 1 tablet (75 mg total) by mouth 2 (two) times daily. Take with food, Disp: 30 tablet, Rfl: 0 .  ibuprofen (ADVIL,MOTRIN) 600 MG tablet, Take 1 tablet (600 mg total) by mouth every 6 (six) hours as needed., Disp: 30 tablet, Rfl: 0 .   methocarbamol (ROBAXIN) 750 MG tablet, Take 1 tablet (750 mg total) by mouth every 4 (four) hours., Disp: 40 tablet, Rfl: 0  No Known Allergies   ROS  As noted in HPI.   Physical Exam  BP 133/87 (BP Location: Left Arm)   Pulse (!) 107   Temp 97.8 F (36.6 C)   Resp 16   Ht 5\' 7"  (1.702 m)   Wt 210 lb (95.3 kg)   LMP 07/13/2017   SpO2 99%   BMI 32.89 kg/m   Constitutional: Well developed, well nourished, no acute distress Eyes:  EOMI, conjunctiva normal bilaterally HENT: Normocephalic, atraumatic,mucus membranes moist Respiratory: Normal inspiratory effort, lungs clear bilaterally. Cardiovascular: Mild regular tachycardia, no murmurs, rubs, gallops. GI: nondistended skin: No rash, skin intact Musculoskeletal: no deformities calves symmetric, nontender, no edema. Back:.  Positive tenderness over the right rhomboid.  Positive muscle spasm.  No other chest wall tenderness.  No rash, bruising, crepitus.  Pain aggravated with right arm movement across the torso. Neurologic: Alert & oriented x 3, no focal neuro deficits Psychiatric: Speech and behavior appropriate   ED Course   Medications - No data to display  No orders of the defined types were placed in this encounter.   No results found for this or any previous visit (from the past 24 hour(s)). No results found.  ED Clinical Impression  Acute right-sided thoracic back pain   ED Assessment/Plan  Presentation consistent with a  musculoskeletal cause of her symptoms.  Discussed the option of doing a chest x-ray and a d-dimer to exclude the possibility of PE given that she has mild tachycardia, but patient denies any shortness of breath.  Will try diclofenac with 1 g of Tylenol twice a day, Robaxin, and if she is not getting better she will return here.  If she gets worse in any way or for other concerns she will go to the ER.  She is comfortable with this plan.  Discussed with MDM, plan and followup with patient.  Discussed sn/sx that should prompt return to the ED. patient agrees with plan.   Meds ordered this encounter  Medications  . diclofenac (VOLTAREN) 75 MG EC tablet    Sig: Take 1 tablet (75 mg total) by mouth 2 (two) times daily. Take with food    Dispense:  30 tablet    Refill:  0  . ibuprofen (ADVIL,MOTRIN) 600 MG tablet    Sig: Take 1 tablet (600 mg total) by mouth every 6 (six) hours as needed.    Dispense:  30 tablet    Refill:  0  . methocarbamol (ROBAXIN) 750 MG tablet    Sig: Take 1 tablet (750 mg total) by mouth every 4 (four) hours.    Dispense:  40 tablet    Refill:  0    *This clinic note was created using [employer]. Therefore, there may be occasional mistakes despite careful proofreading.   ?   Melynda Ripple, MD 08/10/17 1649

## 2018-03-06 IMAGING — DX DG ANKLE COMPLETE 3+V*L*
3 series · 3 of 3 positions shown · non-contrast
Comparison: None.

CLINICAL DATA: Left lateral ankle pain after fall

EXAM:
LEFT ANKLE COMPLETE - 3+ VIEW

[ankle ap]
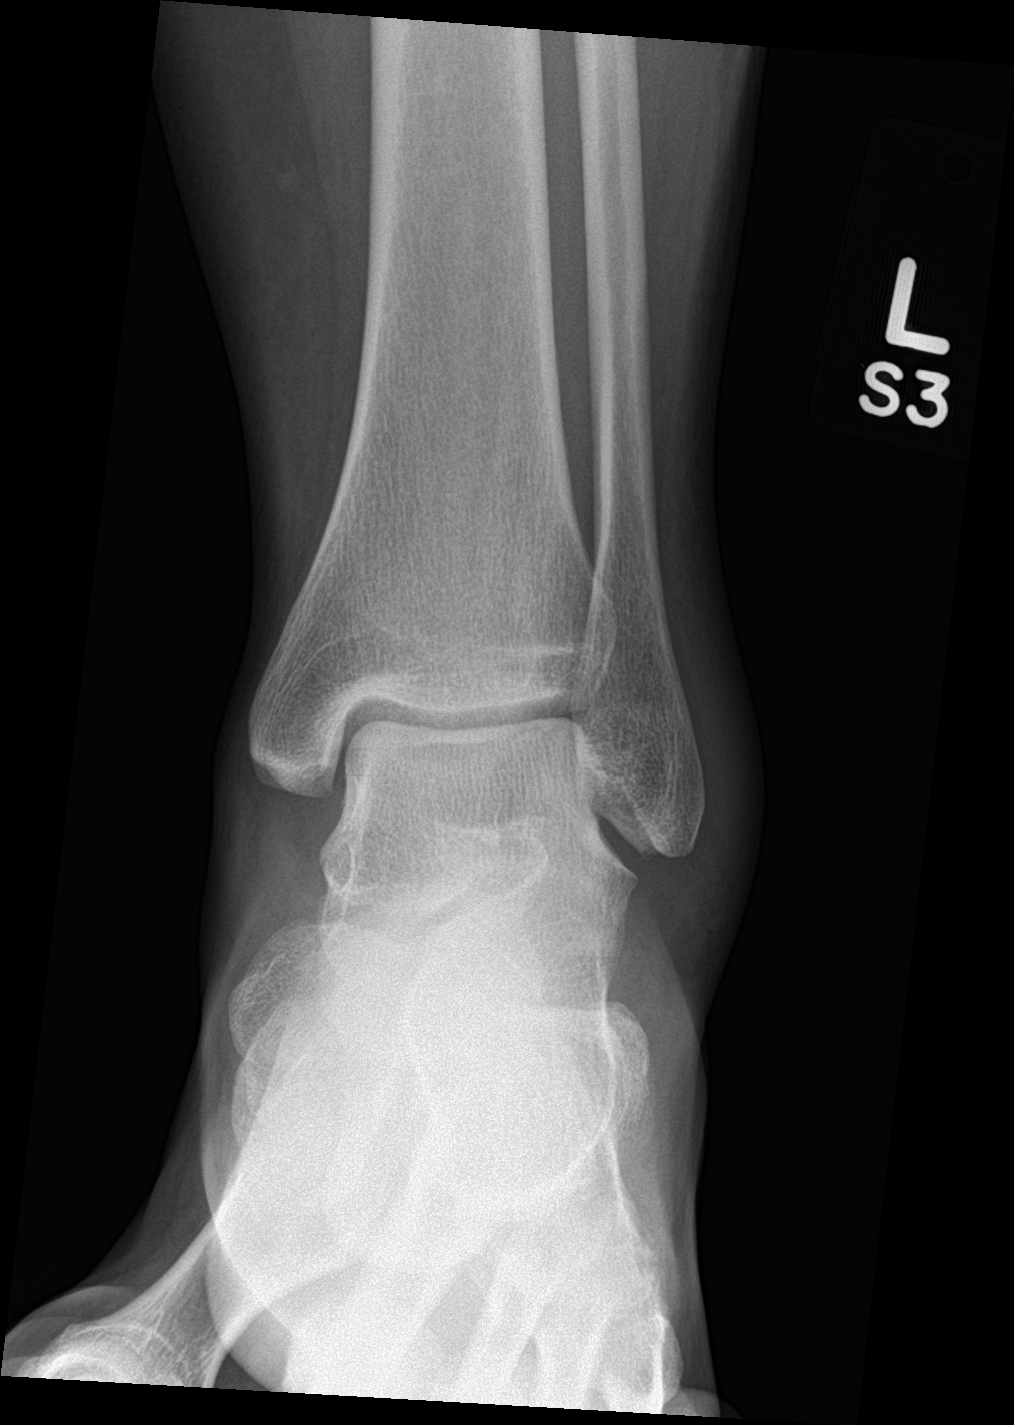

[ankle obl]
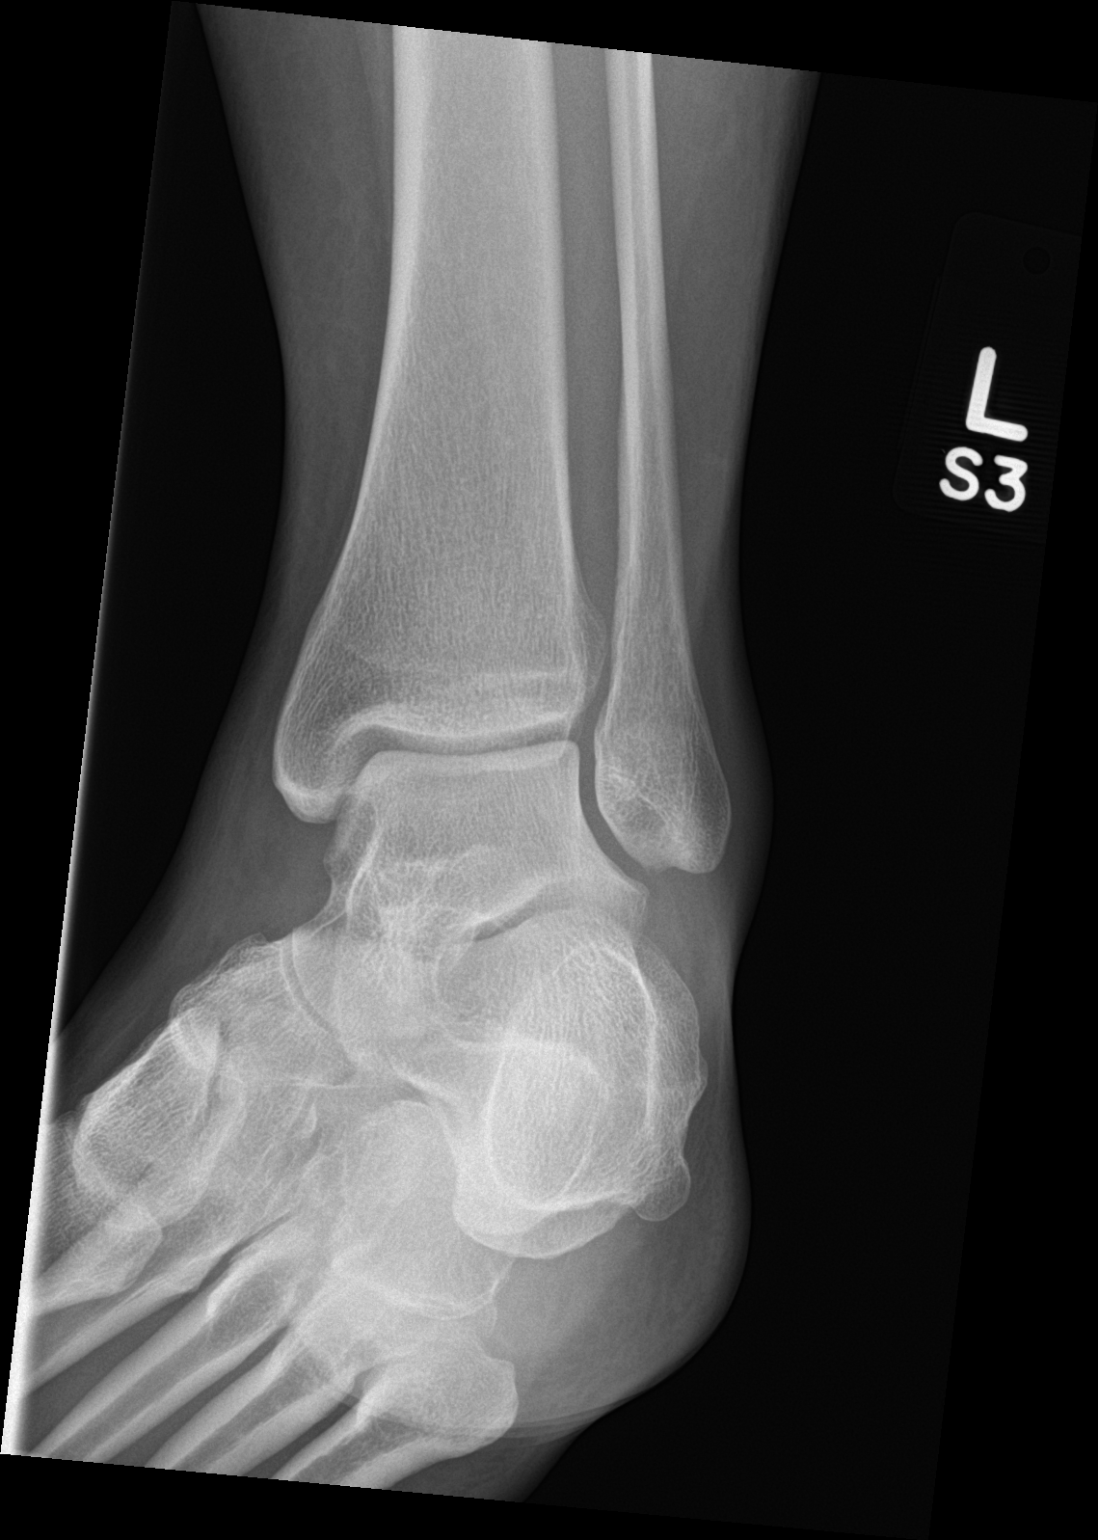

[ankle lat]
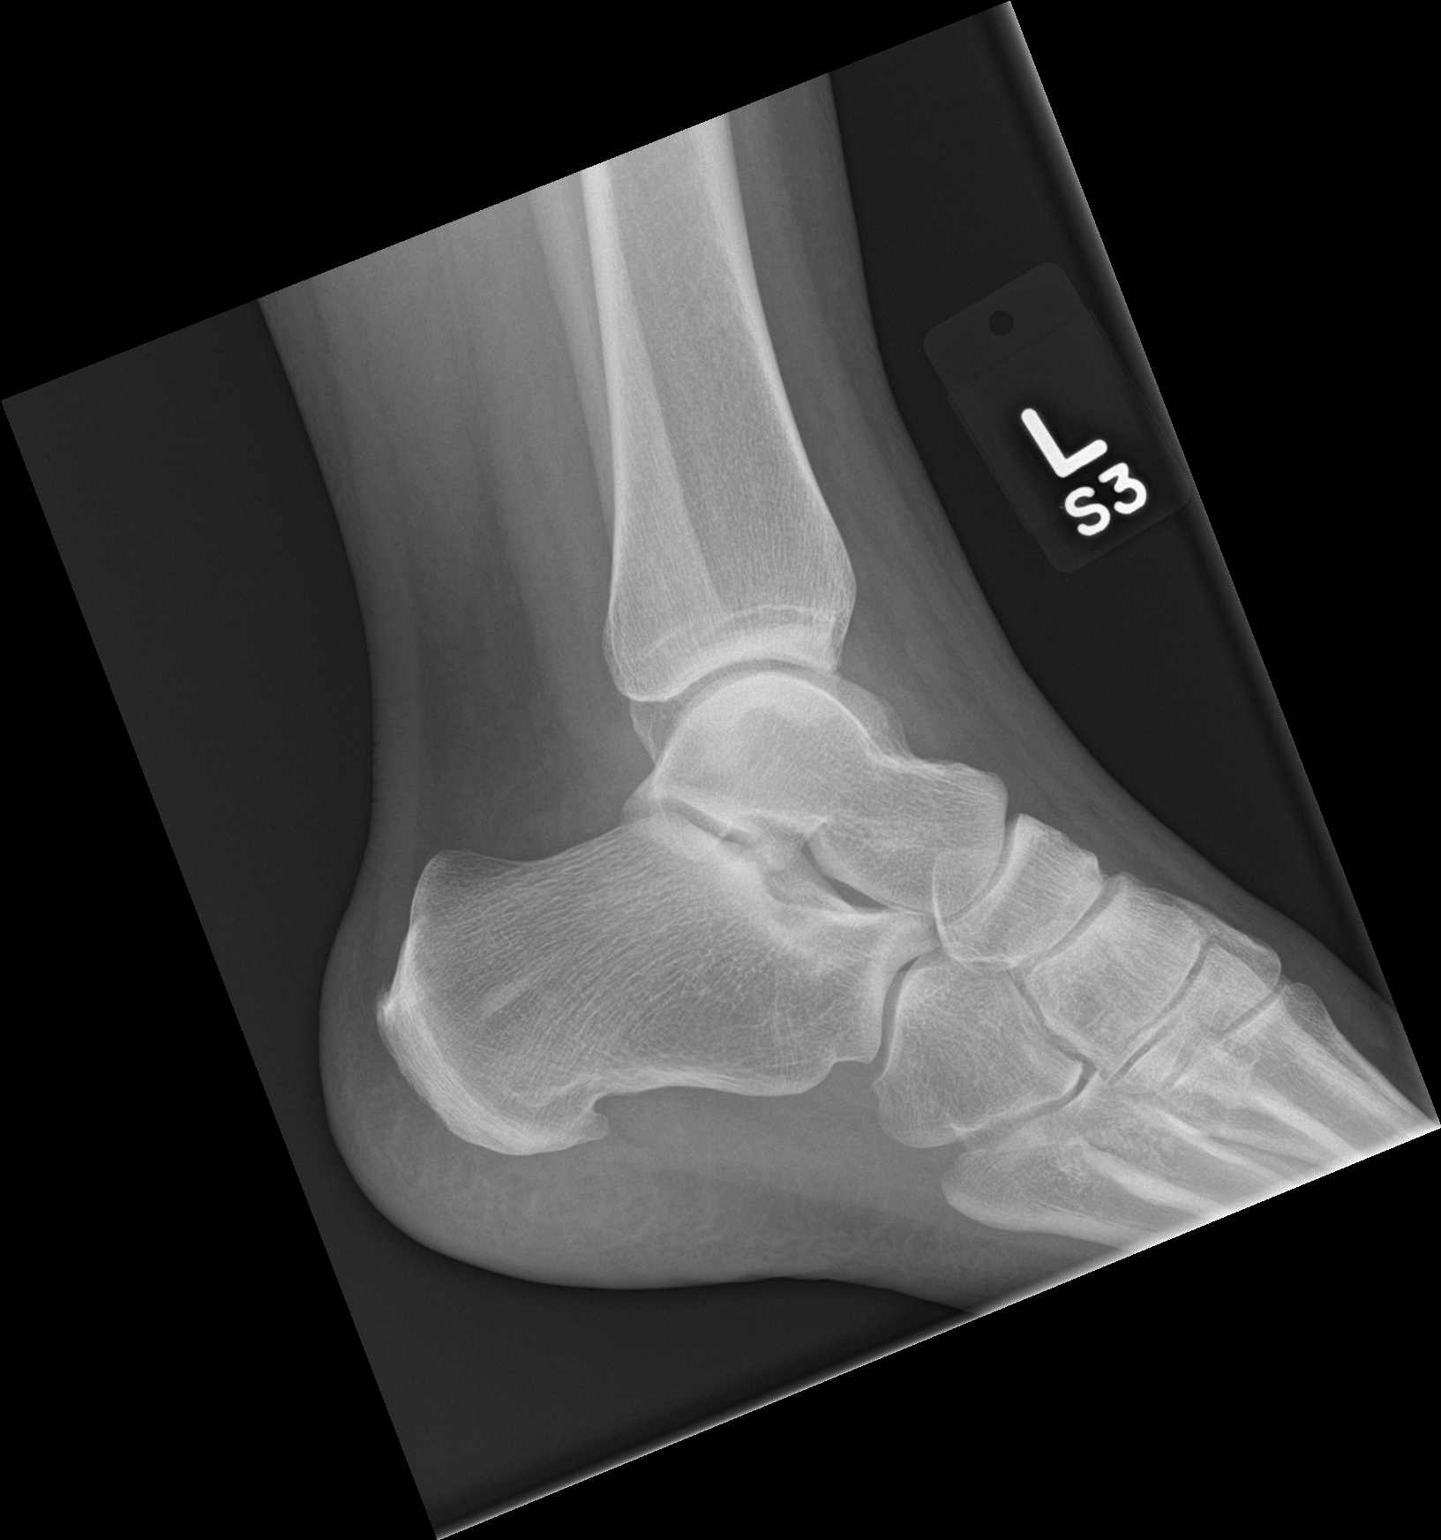

[3 of 3 positions shown; findings below may reference images not displayed]

FINDINGS: Lateral left ankle soft tissue swelling. No fracture or subluxation.
No suspicious focal osseous lesion. No appreciable arthropathy. No
radiopaque foreign body.
IMPRESSION: Lateral left ankle soft tissue swelling. No fracture or subluxation.

## 2018-09-23 ENCOUNTER — Ambulatory Visit: Payer: Self-pay

## 2018-09-23 ENCOUNTER — Ambulatory Visit
Admission: EM | Admit: 2018-09-23 | Discharge: 2018-09-23 | Disposition: A | Discharge status: Home / Self Care | Payer: Medicaid Other | Attending: Family Medicine | Admitting: Family Medicine

## 2018-09-23 ENCOUNTER — Other Ambulatory Visit: Payer: Self-pay

## 2018-09-23 ENCOUNTER — Encounter: Payer: Self-pay | Admitting: Emergency Medicine

## 2018-09-23 DIAGNOSIS — L03113 Cellulitis of right upper limb: Secondary | ICD-10-CM | POA: Insufficient documentation

## 2018-09-23 MED ORDER — HYDROCODONE-ACETAMINOPHEN 5-325 MG PO TABS: 1.0000 | ORAL_TABLET | Freq: Four times a day (QID) | ORAL | 0 refills | Status: DC | PRN

## 2018-09-23 MED ORDER — CEPHALEXIN 500 MG PO CAPS: 500.0000 mg | ORAL_CAPSULE | Freq: Four times a day (QID) | ORAL | 0 refills | Status: DC

## 2018-09-23 NOTE — ED Triage Notes (Signed)
Patient states her right arm was sore at the elbow over the weekend and has continually gotten more swollen, red and painful

## 2018-09-23 NOTE — ED Provider Notes (Signed)
[facility]    CSN: [id number] Arrival date & time: 09/23/18  1357     History   Chief Complaint Chief Complaint  Patient presents with  . Arm Pain    HPI Kelli Morrison is a 33 y.o. female.   Patient is a 34 year old female who presents with right elbow pain, swelling, and redness.  Patient states her elbow began hurting Friday evening like she had bumped it on something but she does not remember any event or injury that night.  She states Saturday the elbow appeared bruised.  However yesterday, she reports her elbow was more painful and was actually more swollen.  Today.  Her elbow is red, warm to the touch with worsening pain and swelling.  Patient states she is taking ibuprofen and using some ice packs.  Again she is never been any actual injury.  She has not orthopedic medications.  She has not had any sticking people with similar joint issues in the past.  She does report smoking 1 pack/day and drinking maybe 1-2 drinks per day as well.     Past Medical History:  Diagnosis Date  . Asthma   . Hypertension     There are no active problems to display for this patient.   Past Surgical History:  Procedure Laterality Date  . CESAREAN SECTION    . WISDOM TOOTH EXTRACTION      OB History   No obstetric history on file.      Home Medications    Prior to Admission medications   Medication Sig Start Date End Date Taking? Authorizing Provider  Fluticasone-Salmeterol (ADVAIR) 100-50 MCG/DOSE AEPB Inhale 1 puff into the lungs 2 (two) times daily.   Yes [provider]  cephALEXin (KEFLEX) 500 MG capsule Take 1 capsule (500 mg total) by mouth 4 (four) times daily. 09/23/18   Luvenia Redden, PA-C  HYDROcodone-acetaminophen (NORCO) 5-325 MG tablet Take 1 tablet by mouth every 6 (six) hours as needed for moderate pain. 09/23/18   Luvenia Redden, PA-C    Family History Family History  Problem Relation Age of Onset  . Hypertension Mother   .  Hypertension Father   . Heart failure Maternal Grandfather     Social History Social History   Tobacco Use  . Smoking status: Current Every Day Smoker    Types: Cigarettes  . Smokeless tobacco: Never Used  Substance Use Topics  . Alcohol use: Yes  . Drug use: Yes    Types: Marijuana     Allergies   Patient has no known allergies.   Review of Systems Review of Systems   Physical Exam Triage Vital Signs ED Triage Vitals  Enc Vitals Group     BP 09/23/18 1429 (!) 142/102     Pulse Rate 09/23/18 1429 96     Resp 09/23/18 1429 18     Temp 09/23/18 1429 98.3 F (36.8 C)     Temp Source 09/23/18 1429 Oral     SpO2 09/23/18 1429 99 %     Weight 09/23/18 1433 215 lb (97.5 kg)     Height 09/23/18 1433 5\' 7"  (1.702 m)     Head Circumference --      Peak Flow --      Pain Score 09/23/18 1433 7     Pain Loc --      Pain Edu? --      Excl. in [place]? --    No data found.  Updated Vital Signs  BP (!) 142/102 (BP Location: Left Arm)   Pulse 96   Temp 98.3 F (36.8 C) (Oral)   Resp 18   Ht 5\' 7"  (1.702 m)   Wt 215 lb (97.5 kg)   LMP 09/23/2018   SpO2 99%   BMI 33.67 kg/m    Physical Exam Constitutional:      Appearance: Normal appearance. She is not ill-appearing.  Eyes:     Extraocular Movements: Extraocular movements intact.     Pupils: Pupils are equal, round, and reactive to light.  Cardiovascular:     Rate and Rhythm: Normal rate and regular rhythm.  Pulmonary:     Effort: Pulmonary effort is normal.  Musculoskeletal:     Right elbow: She exhibits swelling.       Arms:  Skin:    Findings: Erythema (to RUE) present.  Neurological:     General: No focal deficit present.     Mental Status: She is alert and oriented to person, place, and time.  Psychiatric:        Behavior: Behavior normal.         UC Treatments / Results  Labs (all labs ordered are listed, but only abnormal results are displayed) Labs Reviewed - No data to  display  EKG None  Radiology Dg Elbow Complete Right  Result Date: 09/23/2018 CLINICAL DATA:  Swelling soreness and redness of the right elbow. EXAM: RIGHT ELBOW - COMPLETE 3+ VIEW COMPARISON:  None. FINDINGS: There is no evidence of fracture, or dislocation. No definite joint effusion. Soft tissue swelling posterior to the olecranon extending superiorly and inferiorly along the soft tissues of the posterior arm. IMPRESSION: No acute osseous abnormalities. Soft tissue swellings posterior to the olecranon extending superiorly and inferiorly along the posterior arm. Electronically Signed   By: Fidela Salisbury M.D.   On: 09/23/2018 15:55    Procedures Procedures (including critical care time)  Medications Ordered in UC Medications - No data to display  Initial Impression / Assessment and Plan / UC Course  I have reviewed the triage vital signs and the nursing notes.  Pertinent labs & imaging results that were available during my care of the patient were reviewed by me and considered in my medical decision making (see chart for details).    Patient with right elbow pain that started Friday, bruising noted Saturday, swelling yesterday, and today with redness, increased swelling increased pain and warmth to touch.  X-ray without any bony abnormalities but soft tissue swelling noted.  Will give patient prescription for Keflex for her cellulitis.  Patient also given prescription for Norco for pain not improved after ibuprofen.  Patient advised to refrain from any alcohol consumption while taking this medication.  Patient advised to return to clinic if no improvement after 3 to 4 days.  Patient verbalized understanding and agrees with plan.  Final Clinical Impressions(s) / UC Diagnoses   Final diagnoses:  Cellulitis of right arm     Discharge Instructions     -Keflex: one tablet four times a day for 8 days -Norco: one tablet every 6 hours as needed for pain not relieved with  ibuprofen -Can use over the counter ibuprofen for pain  -avoid alcohol when taking the Norco.    ED Prescriptions    Medication Sig Dispense Auth. Provider   cephALEXin (KEFLEX) 500 MG capsule Take 1 capsule (500 mg total) by mouth 4 (four) times daily. 40 capsule Luvenia Redden, PA-C   HYDROcodone-acetaminophen (NORCO) 5-325 MG tablet Take 1  tablet by mouth every 6 (six) hours as needed for moderate pain. 30 tablet Luvenia Redden, PA-C     Controlled Substance Prescriptions [place] Controlled Substance Registry consulted? Yes, I have consulted the [place] Controlled Substances Registry for this patient, and feel the risk/benefit ratio today is favorable for proceeding with this prescription for a controlled substance.   Luvenia Redden, PA-C 09/23/18 1641

## 2018-09-23 NOTE — Discharge Instructions (Addendum)
-  Keflex: one tablet four times a day for 8 days -Norco: one tablet every 6 hours as needed for pain not relieved with ibuprofen -Can use over the counter ibuprofen for pain  -avoid alcohol when taking the Norco.

## 2020-07-14 ENCOUNTER — Other Ambulatory Visit: Payer: Self-pay

## 2020-07-14 ENCOUNTER — Ambulatory Visit
Admission: RE | Admit: 2020-07-14 | Discharge: 2020-07-14 | Disposition: A | Discharge status: Home / Self Care | Payer: Medicaid Other | Source: Ambulatory Visit | Attending: Family Medicine | Admitting: Family Medicine

## 2020-07-14 VITALS — BP 148/98 | HR 77 | Temp 98.2°F | Resp 17 | Ht 67.0 in | Wt 240.0 lb

## 2020-07-14 DIAGNOSIS — J45901 Unspecified asthma with (acute) exacerbation: Secondary | ICD-10-CM

## 2020-07-14 MED ORDER — FLUTICASONE-SALMETEROL 250-50 MCG/DOSE IN AEPB: 1.0000 | INHALATION_SPRAY | Freq: Two times a day (BID) | RESPIRATORY_TRACT | 3 refills | Status: DC

## 2020-07-14 MED ORDER — BENZONATATE 200 MG PO CAPS: 200.0000 mg | ORAL_CAPSULE | Freq: Three times a day (TID) | ORAL | 0 refills | Status: DC | PRN

## 2020-07-14 MED ORDER — PREDNISONE 10 MG PO TABS: ORAL_TABLET | ORAL | 0 refills | Status: DC

## 2020-07-14 NOTE — ED Provider Notes (Signed)
[facility]    CSN: [id number] Arrival date & time: 07/14/20  1446      History   Chief Complaint Chief Complaint  Patient presents with   Cough   HPI  34 year old female with a history of asthma presents with cough and wheezing.  Patient reports ongoing cough for the past 2 weeks.  She has been wheezing as of this week.  Patient has asthma and also smokes.  She states that she has had some chest discomfort and rib discomfort with the amount of coughing.  She has used albuterol without resolution.  She was previously on Advair and was doing well but has not been to a physician in quite some time.  No other associated symptoms.  No fever.  No other complaints.  Past Medical History:  Diagnosis Date   Asthma    Hypertension    Past Surgical History:  Procedure Laterality Date   CESAREAN SECTION     WISDOM TOOTH EXTRACTION     OB History   No obstetric history on file.    Home Medications    Prior to Admission medications   Medication Sig Start Date End Date Taking? Authorizing Provider  benzonatate (TESSALON) 200 MG capsule Take 1 capsule (200 mg total) by mouth 3 (three) times daily as needed for cough. 07/14/20   Coral Spikes, DO  Fluticasone-Salmeterol (ADVAIR DISKUS) 250-50 MCG/DOSE AEPB Inhale 1 puff into the lungs in the morning and at bedtime. 07/14/20   Coral Spikes, DO  predniSONE (DELTASONE) 10 MG tablet 50 mg daily x 2 days, then 40 mg daily x 2 days, then 30 mg daily x 2 days, then 20 mg daily x 2 days, then 10 mg daily x 2 days. 07/14/20   Coral Spikes, DO    Family History Family History  Problem Relation Age of Onset   Hypertension Mother    Hypertension Father    Heart failure Maternal Grandfather     Social History Social History   Tobacco Use   Smoking status: Current Every Day Smoker    Types: Cigarettes   Smokeless tobacco: Never Used  Scientific laboratory technician Use: Never used  Substance Use Topics   Alcohol use:  Yes   Drug use: Yes    Types: Marijuana     Allergies   Patient has no known allergies.   Review of Systems Review of Systems  Constitutional: Negative for fever.  Respiratory: Positive for cough and shortness of breath.    Physical Exam Triage Vital Signs ED Triage Vitals  Enc Vitals Group     BP 07/14/20 1509 (!) 148/98     Pulse Rate 07/14/20 1509 77     Resp 07/14/20 1509 17     Temp 07/14/20 1509 98.2 F (36.8 C)     Temp Source 07/14/20 1509 Oral     SpO2 07/14/20 1509 100 %     Weight 07/14/20 1506 240 lb (108.9 kg)     Height 07/14/20 1506 5\' 7"  (1.702 m)     Head Circumference --      Peak Flow --      Pain Score 07/14/20 1506 5     Pain Loc --      Pain Edu? --      Excl. in [place]? --    Updated Vital Signs BP (!) 148/98 (BP Location: Right Arm)    Pulse 77    Temp 98.2 F (36.8 C) (Oral)  Resp 17    Ht 5\' 7"  (1.702 m)    Wt 108.9 kg    LMP 07/06/2020    SpO2 100%    BMI 37.59 kg/m   Visual Acuity Right Eye Distance:   Left Eye Distance:   Bilateral Distance:    Right Eye Near:   Left Eye Near:    Bilateral Near:     Physical Exam Vitals and nursing note reviewed.  Constitutional:      General: She is not in acute distress.    Appearance: Normal appearance. She is obese. She is not ill-appearing.  HENT:     Head: Normocephalic and atraumatic.  Eyes:     General:        Right eye: No discharge.        Left eye: No discharge.     Conjunctiva/sclera: Conjunctivae normal.  Cardiovascular:     Rate and Rhythm: Normal rate and regular rhythm.     Heart sounds: No murmur heard.   Pulmonary:     Effort: Pulmonary effort is normal.     Comments: Diffuse expiratory wheezing. Neurological:     Mental Status: She is alert.  Psychiatric:        Mood and Affect: Mood normal.        Behavior: Behavior normal.    UC Treatments / Results  Labs (all labs ordered are listed, but only abnormal results are displayed) Labs Reviewed - No data to  display  EKG   Radiology No results found.  Procedures Procedures (including critical care time)  Medications Ordered in UC Medications - No data to display  Initial Impression / Assessment and Plan / UC Course  I have reviewed the triage vital signs and the nursing notes.  Pertinent labs & imaging results that were available during my care of the patient were reviewed by me and considered in my medical decision making (see chart for details).    34 year old female presents with asthma exacerbation.  Treating with prednisone.  Tessalon Perles for cough.  Placing back on Advair.  Final Clinical Impressions(s) / UC Diagnoses   Final diagnoses:  Asthma with acute exacerbation, unspecified asthma severity, unspecified whether persistent     Discharge Instructions     Medications as prescribed.  Take care  Dr. Lacinda Axon    ED Prescriptions    Medication Sig Dispense Auth. Provider   predniSONE (DELTASONE) 10 MG tablet 50 mg daily x 2 days, then 40 mg daily x 2 days, then 30 mg daily x 2 days, then 20 mg daily x 2 days, then 10 mg daily x 2 days. 30 tablet Mardene Lessig G, DO   Fluticasone-Salmeterol (ADVAIR DISKUS) 250-50 MCG/DOSE AEPB Inhale 1 puff into the lungs in the morning and at bedtime. 60 each Ahyan Kreeger G, DO   benzonatate (TESSALON) 200 MG capsule Take 1 capsule (200 mg total) by mouth 3 (three) times daily as needed for cough. 30 capsule Coral Spikes, DO     PDMP not reviewed this encounter.   Coral Spikes, [place] 07/14/20 1559

## 2020-07-14 NOTE — ED Triage Notes (Signed)
Patient complains of cough and chest congestion x 2 weeks. States that when she coughs her chest aches.

## 2020-07-14 NOTE — Discharge Instructions (Signed)
Medications as prescribed. ° °Take care ° °Dr. Alick Lecomte  °

## 2020-11-02 ENCOUNTER — Ambulatory Visit
Admission: RE | Admit: 2020-11-02 | Discharge: 2020-11-02 | Disposition: A | Discharge status: Home / Self Care | Payer: Medicaid Other | Source: Ambulatory Visit

## 2020-11-02 ENCOUNTER — Other Ambulatory Visit: Payer: Self-pay

## 2020-11-02 VITALS — BP 134/90 | HR 87 | Temp 98.0°F | Resp 18 | Ht 67.5 in | Wt 245.0 lb

## 2020-11-02 DIAGNOSIS — M62838 Other muscle spasm: Secondary | ICD-10-CM | POA: Diagnosis not present

## 2020-11-02 DIAGNOSIS — M940 Chondrocostal junction syndrome [Tietze]: Secondary | ICD-10-CM | POA: Diagnosis not present

## 2020-11-02 MED ORDER — PREDNISONE 10 MG (21) PO TBPK: ORAL_TABLET | Freq: Every day | ORAL | 0 refills | Status: DC

## 2020-11-02 MED ORDER — CYCLOBENZAPRINE HCL 5 MG PO TABS: 5.0000 mg | ORAL_TABLET | Freq: Three times a day (TID) | ORAL | 0 refills | Status: DC | PRN

## 2020-11-02 NOTE — ED Triage Notes (Signed)
Pt c/o left mid back pain since this past Friday. Pt reports pain radiates into her left rib area. Pt does have a disabled son and does pick him up to move him. Pt denies any other known injuries. Pt denies urinary symptoms. Pt does report pain with deep inhalation and with some movement of her shoulder.

## 2020-11-02 NOTE — ED Provider Notes (Signed)
[facility]    CSN: [id number] Arrival date & time: 11/02/20  1449      History   Chief Complaint Chief Complaint  Patient presents with  . Back Pain    Mid left    HPI Axelle Szwed Hilscher is a 35 y.o. female.   HPI   Back Pain: Pt reports that since Friday she has had left rib and back pain.  She thinks that she may have pulled something when picking up her disabled son but is unsure.  She states that chronically she has muscle tension of her back and muscle spasms.  She states that the pain is constant moderate in nature.  Pain wraps around the left side of her rib cage in her thoracic region extending to the inferior aspect of her rib cage.  Pain worsens with deep breathing.  Pain is reproducible to palpation.  She has not taken anything to help for symptoms.  She denies any new cough, fevers, abdominal pain, urinary symptoms.  [facility]: 10/07/2020 with no plans for pregnancy.   Past Medical History:  Diagnosis Date  . Asthma   . Hypertension     There are no problems to display for this patient.   Past Surgical History:  Procedure Laterality Date  . CESAREAN SECTION    . WISDOM TOOTH EXTRACTION      OB History   No obstetric history on file.      Home Medications    Prior to Admission medications   Medication Sig Start Date End Date Taking? Authorizing Provider  albuterol (VENTOLIN HFA) 108 (90 Base) MCG/ACT inhaler Inhale 2 puffs into the lungs every 6 (six) hours as needed. 01/31/11  Yes [provider]  Fluticasone-Salmeterol (ADVAIR DISKUS) 250-50 MCG/DOSE AEPB Inhale 1 puff into the lungs in the morning and at bedtime. 07/14/20  Yes Cook, Jayce G, DO  benzonatate (TESSALON) 200 MG capsule Take 1 capsule (200 mg total) by mouth 3 (three) times daily as needed for cough. 07/14/20   Coral Spikes, DO  predniSONE (DELTASONE) 10 MG tablet 50 mg daily x 2 days, then 40 mg daily x 2 days, then 30 mg daily x 2 days, then 20 mg daily x 2 days, then 10  mg daily x 2 days. 07/14/20   Coral Spikes, DO    Family History Family History  Problem Relation Age of Onset  . Hypertension Mother   . Hypertension Father   . Heart failure Maternal Grandfather     Social History Social History   Tobacco Use  . Smoking status: Current Every Day Smoker    Types: Cigarettes  . Smokeless tobacco: Never Used  Vaping Use  . Vaping Use: Never used  Substance Use Topics  . Alcohol use: Yes  . Drug use: Yes    Types: Marijuana     Allergies   Patient has no known allergies.   Review of Systems Review of Systems  As stated above in HPI Physical Exam Triage Vital Signs ED Triage Vitals  Enc Vitals Group     BP 11/02/20 1506 134/90     Pulse Rate 11/02/20 1506 87     Resp 11/02/20 1506 18     Temp 11/02/20 1506 98 F (36.7 C)     Temp Source 11/02/20 1506 Oral     SpO2 11/02/20 1506 98 %     Weight 11/02/20 1504 245 lb (111.1 kg)     Height 11/02/20 1504 5' 7.5" (1.715 m)  Head Circumference --      Peak Flow --      Pain Score 11/02/20 1503 10     Pain Loc --      Pain Edu? --      Excl. in [place]? --    No data found.  Updated Vital Signs BP 134/90 (BP Location: Left Arm)   Pulse 87   Temp 98 F (36.7 C) (Oral)   Resp 18   Ht 5' 7.5" (1.715 m)   Wt 245 lb (111.1 kg)   LMP 10/07/2020 (Approximate)   SpO2 98%   BMI 37.81 kg/m   Physical Exam Vitals and nursing note reviewed.  Constitutional:      General: She is not in acute distress.    Appearance: Normal appearance. She is not ill-appearing, toxic-appearing or diaphoretic.  Cardiovascular:     Rate and Rhythm: Normal rate and regular rhythm.     Heart sounds: Normal heart sounds.  Pulmonary:     Effort: Pulmonary effort is normal. No respiratory distress.     Breath sounds: Normal breath sounds. No stridor. No wheezing, rhonchi or rales.  Chest:     Chest wall: No tenderness.    Abdominal:     General: Abdomen is flat. Bowel sounds are normal. There is  no distension.     Palpations: Abdomen is soft. There is no mass.     Tenderness: There is no abdominal tenderness. There is no right CVA tenderness, left CVA tenderness, guarding or rebound.     Hernia: No hernia is present.  Musculoskeletal:        General: Normal range of motion.     Cervical back: Normal range of motion and neck supple. Spasms present. No rigidity or tenderness.     Thoracic back: Spasms and tenderness present.     Lumbar back: Spasms present.       Back:  Skin:    Coloration: Skin is not jaundiced.     Findings: No rash.  Neurological:     General: No focal deficit present.     Mental Status: She is alert.      UC Treatments / Results  Labs (all labs ordered are listed, but only abnormal results are displayed) Labs Reviewed - No data to display  EKG   Radiology No results found.  Procedures Procedures (including critical care time)  Medications Ordered in UC Medications - No data to display  Initial Impression / Assessment and Plan / UC Course  I have reviewed the triage vital signs and the nursing notes.  Pertinent labs & imaging results that were available during my care of the patient were reviewed by me and considered in my medical decision making (see chart for details).     New.  I highly suspect that she is having muscle spasms in her back that and return have caused her to brace her abdomen insert for support and this has created costochondritis.  She does not have any abdominal discomfort or reproducibility of pain nor does she have any CVA tenderness.  We discussed more conservative treatment regimen such as NSAIDs and stretching exercises versus prednisone and muscle relaxer.  Patient elects prednisone and muscle relaxer.  We discussed how to use along with common potential side effects and precautions.  We reviewed red flag signs and symptoms. Final Clinical Impressions(s) / UC Diagnoses   Final diagnoses:  None   Discharge  Instructions   None    ED Prescriptions  None     PDMP not reviewed this encounter.   Hughie Closs, [place] 11/02/20 1605

## 2020-11-10 ENCOUNTER — Ambulatory Visit
Admission: EM | Admit: 2020-11-10 | Discharge: 2020-11-10 | Disposition: A | Discharge status: Home / Self Care | Payer: Medicaid Other | Attending: Physician Assistant | Admitting: Physician Assistant

## 2020-11-10 ENCOUNTER — Encounter: Payer: Self-pay | Admitting: Emergency Medicine

## 2020-11-10 ENCOUNTER — Other Ambulatory Visit: Payer: Self-pay

## 2020-11-10 ENCOUNTER — Ambulatory Visit (INDEPENDENT_AMBULATORY_CARE_PROVIDER_SITE_OTHER): Payer: Medicaid Other

## 2020-11-10 DIAGNOSIS — J984 Other disorders of lung: Secondary | ICD-10-CM | POA: Diagnosis not present

## 2020-11-10 DIAGNOSIS — R0781 Pleurodynia: Secondary | ICD-10-CM

## 2020-11-10 DIAGNOSIS — R0782 Intercostal pain: Secondary | ICD-10-CM | POA: Diagnosis not present

## 2020-11-10 DIAGNOSIS — M549 Dorsalgia, unspecified: Secondary | ICD-10-CM

## 2020-11-10 DIAGNOSIS — M62830 Muscle spasm of back: Secondary | ICD-10-CM | POA: Diagnosis not present

## 2020-11-10 MED ORDER — TIZANIDINE HCL 4 MG PO TABS: 4.0000 mg | ORAL_TABLET | Freq: Three times a day (TID) | ORAL | 0 refills | Status: AC | PRN

## 2020-11-10 MED ORDER — AZITHROMYCIN 250 MG PO TABS: 250.0000 mg | ORAL_TABLET | Freq: Every day | ORAL | 0 refills | Status: DC

## 2020-11-10 MED ORDER — KETOROLAC TROMETHAMINE 60 MG/2ML IM SOLN
60.0000 mg | Freq: Once | INTRAMUSCULAR | Status: AC
  Administered 2020-11-10: 60 mg via INTRAMUSCULAR

## 2020-11-10 MED ORDER — HYDROCODONE-ACETAMINOPHEN 5-325 MG PO TABS: 1.0000 | ORAL_TABLET | Freq: Four times a day (QID) | ORAL | 0 refills | Status: AC | PRN

## 2020-11-10 MED ORDER — NAPROXEN SODIUM 550 MG PO TABS: 550.0000 mg | ORAL_TABLET | Freq: Two times a day (BID) | ORAL | 0 refills | Status: AC

## 2020-11-10 NOTE — ED Triage Notes (Signed)
Pt c/o of back pain that radiates into her ribs. She was seen on 11/02/20 and given muscle relaxer and prednisone. She states she was feeling better but she has had to work long hours the last 3 days and the pain is worse.

## 2020-11-10 NOTE — ED Provider Notes (Signed)
[facility]    CSN: [id number] Arrival date & time: 11/10/20  1447      History   Chief Complaint Chief Complaint  Patient presents with  . Back Pain    HPI Kelli Morrison is a 35 y.o. female presenting for continued left sided thoracic back pain and left rib pain.  Patient says she thinks she may have injured herself while lifting her son who is 55 years old who has cerebral palsy.  Pain is worse in the back and seems to radiate to the left lateral ribs and left anterior lower ribs.  Patient seen in clinic 8 days ago and treated for suspected costochondritis and muscle spasms with prednisone and cyclobenzaprine. Patient says she was initially feeling better (70% better) after a couple of days and then worked a lot of hours and pain came back and has remained constant and aching with occasional sharp and stabbing pains with movement and sometimes on breathing. Denies recent illness. No fever, cough, congestion, chest pain, lower back pain, SOB, palpitations, n/v, dizziness or fatigue.  No urinary symptoms.  Last menstrual period started 5 days ago.  Patient denies possibility of pregnancy.  Past medical history significant for asthma and hypertension.  Denies any significant back injuries or recurrent back pain.  No history of cardiac disease.  No other complaints or concerns.  HPI  Past Medical History:  Diagnosis Date  . Asthma   . Hypertension     There are no problems to display for this patient.   Past Surgical History:  Procedure Laterality Date  . CESAREAN SECTION    . WISDOM TOOTH EXTRACTION      OB History   No obstetric history on file.      Home Medications    Prior to Admission medications   Medication Sig Start Date End Date Taking? Authorizing Provider  albuterol (VENTOLIN HFA) 108 (90 Base) MCG/ACT inhaler Inhale 2 puffs into the lungs every 6 (six) hours as needed. 01/31/11  Yes [provider]  azithromycin (ZITHROMAX) 250 MG  tablet Take 1 tablet (250 mg total) by mouth daily. Take first 2 tablets together, then 1 every day until finished. 11/10/20  Yes Danton Clap, PA-C  Fluticasone-Salmeterol (ADVAIR DISKUS) 250-50 MCG/DOSE AEPB Inhale 1 puff into the lungs in the morning and at bedtime. 07/14/20  Yes Cook, Jayce G, DO  HYDROcodone-acetaminophen (NORCO/VICODIN) 5-325 MG tablet Take 1 tablet by mouth every 6 (six) hours as needed for up to 5 days. 11/10/20 11/15/20 Yes Laurene Footman B, PA-C  naproxen sodium (ANAPROX DS) 550 MG tablet Take 1 tablet (550 mg total) by mouth 2 (two) times daily with a meal for 15 days. 11/10/20 11/25/20 Yes Laurene Footman B, PA-C  tiZANidine (ZANAFLEX) 4 MG tablet Take 1 tablet (4 mg total) by mouth every 8 (eight) hours as needed for up to 10 days for muscle spasms. 11/10/20 11/20/20 Yes Danton Clap, PA-C    Family History Family History  Problem Relation Age of Onset  . Hypertension Mother   . Hypertension Father   . Heart failure Maternal Grandfather     Social History Social History   Tobacco Use  . Smoking status: Current Every Day Smoker    Types: Cigarettes  . Smokeless tobacco: Never Used  Vaping Use  . Vaping Use: Never used  Substance Use Topics  . Alcohol use: Yes  . Drug use: Yes    Types: Marijuana     Allergies  Patient has no known allergies.   Review of Systems Review of Systems  Constitutional: Negative for fatigue and fever.  HENT: Negative for congestion.   Respiratory: Negative for cough and shortness of breath.   Cardiovascular: Negative for chest pain.  Gastrointestinal: Negative for abdominal pain, nausea and vomiting.  Genitourinary: Negative for dysuria.  Musculoskeletal: Positive for arthralgias (left rib pain) and back pain. Negative for joint swelling and neck pain.  Skin: Negative for rash and wound.  Neurological: Negative for dizziness, weakness, numbness and headaches.     Physical Exam Triage Vital Signs ED Triage Vitals   Enc Vitals Group     BP 11/10/20 1501 (!) 157/102     Pulse Rate 11/10/20 1501 (!) 104     Resp 11/10/20 1501 18     Temp 11/10/20 1501 (!) 97.5 F (36.4 C)     Temp Source 11/10/20 1501 Oral     SpO2 11/10/20 1501 100 %     Weight 11/10/20 1459 244 lb 14.9 oz (111.1 kg)     Height 11/10/20 1459 5' 7.5" (1.715 m)     Head Circumference --      Peak Flow --      Pain Score 11/10/20 1459 8     Pain Loc --      Pain Edu? --      Excl. in [place]? --    No data found.  Updated Vital Signs BP (!) 150/116   Pulse (!) 106   Temp (!) 97.5 F (36.4 C) (Oral)   Resp 18   Ht 5' 7.5" (1.715 m)   Wt 244 lb 14.9 oz (111.1 kg)   LMP 11/05/2020   SpO2 100%   BMI 37.80 kg/m      Physical Exam Vitals and nursing note reviewed.  Constitutional:      General: She is not in acute distress.    Appearance: Normal appearance. She is not ill-appearing or toxic-appearing.  HENT:     Head: Normocephalic and atraumatic.  Eyes:     General: No scleral icterus.       Right eye: No discharge.        Left eye: No discharge.     Conjunctiva/sclera: Conjunctivae normal.  Cardiovascular:     Rate and Rhythm: Normal rate and regular rhythm.     Heart sounds: Normal heart sounds.  Pulmonary:     Effort: Pulmonary effort is normal. No respiratory distress.     Breath sounds: Normal breath sounds. No wheezing, rhonchi or rales.  Chest:     Chest wall: Tenderness (Diffuse poorly localized TTP left lateral ribs and left anterior ribs 7-9) present.  Abdominal:     Palpations: Abdomen is soft.     Tenderness: There is no abdominal tenderness. There is no right CVA tenderness or left CVA tenderness.  Musculoskeletal:     Cervical back: Normal range of motion and neck supple. No tenderness.     Thoracic back: Tenderness (Diffuse TTP throughout left posterior scapular muscles and left thoracic paravertebral muscles) present. No swelling or bony tenderness. Normal range of motion.       Back:  Skin:     General: Skin is dry.  Neurological:     General: No focal deficit present.     Mental Status: She is alert. Mental status is at baseline.     Motor: No weakness.     Gait: Gait normal.  Psychiatric:        Mood and Affect:  Mood normal.        Behavior: Behavior normal.        Thought Content: Thought content normal.      UC Treatments / Results  Labs (all labs ordered are listed, but only abnormal results are displayed) Labs Reviewed - No data to display  EKG   Radiology DG Chest 2 View  Result Date: 11/10/2020 CLINICAL DATA:  Left chest and back pain EXAM: CHEST - 2 VIEW COMPARISON:  02/11/2015 FINDINGS: Heart size is normal. Right chest is clear. There is mild blunting of the left lateral costophrenic angle and mild patchy density at the left lung base. Findings could be due to atelectasis and or pneumonia with a small amount of pleural fluid. Mild chronic curvature of the spine again demonstrated. No rib abnormality seen. IMPRESSION: Mild patchy density at the left base with some pleural density. Findings could be due to atelectasis and or pneumonia with a small amount of pleural fluid. Electronically Signed   By: Nelson Chimes M.D.   On: 11/10/2020 16:06    Procedures Procedures (including critical care time)  Medications Ordered in UC Medications  ketorolac (TORADOL) injection 60 mg (60 mg Intramuscular Given 11/10/20 1527)    Initial Impression / Assessment and Plan / UC Course  I have reviewed the triage vital signs and the nursing notes.  Pertinent labs & imaging results that were available during my care of the patient were reviewed by me and considered in my medical decision making (see chart for details).   35 y/o female presenting for left sided thoracic back pain and left sided rib pain x nearly 2 weeks. Initially had improvement in pain with prednisone (on a 12 day taper) and cyclobenzaprine (10 mg TID PRN). Pain worsening again since working long shifts  recently.   BP elevated at 157/102 and HR 104.   Exam significant for poorly localized TTP of the left posterior scapula (no pain on movement of shoulder), left lateral ribs and left anterior ribs (7-9). Chest clear to auscultation and heart rate is regular.   CXR obtained today since it was not previously to assess for rib fracture, pneumonia, etc. Imaging independently reviewed by me.   Patient given 60 mg IM ketorolac in clinic.   Chest x-ray notes possible atelectasis versus of the left lung base.  Patient does admit to having a chronic cough due to smoking and asthma.  She says that is occasionally productive.  She denies any chest pain, shortness of breath or fevers.  It is more likely that the density is atelectasis since she does have increased pain in this region when she breathes and is probably guarding her breath.  However, symptoms have been ongoing for a couple of weeks and she does have some risk factors for pneumonia, will treat with azithromycin.  Treating suspected thoracic muscle spasms and strain tizanidine, naproxen, and as needed for pain relief.  Controlled substance database reviewed and patient low risk for abuse.  Advised her that she should follow-up with PCP for recheck if not feeling better in the next couple weeks or symptoms worsen.  Also gave her contact information for [facility].  Advised she will likely need physical therapy if this continues.  I did review some exercises with her and printed her out information about that.  ED precautions for back pain reviewed with patient.   Final Clinical Impressions(s) / UC Diagnoses   Final diagnoses:  Upper back pain on left side  Rib pain on left  side  Muscle spasm of back  Lung density on x-ray     Discharge Instructions     Chest x-ray is normal. This is likely continued muscle spasms and thoracic muscle strain. You may need physical therapy. You should avoid any lifting >15 lbs and no over head lifting and  limit overhead reaching until improving.   BACK PAIN: Stressed avoiding painful activities . RICE (REST, ICE, COMPRESSION, ELEVATION) guidelines reviewed. May alternate ice and heat. Consider use of muscle rubs, Salonpas patches, etc. Use medications as directed including muscle relaxers if prescribed. Take anti-inflammatory medications as prescribed or OTC NSAIDs/Tylenol.  F/u with PCP in 7-10 days for reexamination, and please feel free to call or return to the urgent care at any time for any questions or concerns you may have and we will be happy to help you!   BACK PAIN RED FLAGS: If the back pain acutely worsens or there are any red flag symptoms such as numbness/tingling, leg weakness, saddle anesthesia, or loss of bowel/bladder control, go immediately to the ER. Follow up with [place] as scheduled or sooner if the pain does not begin to resolve or if it worsens before the  follow up  You may have a condition requiring you to follow up with Orthopedics so please call one of the following office for appointment:   [facility] [address] [place], [place] [address] Phone: [phone]  [facility] [address] [place], [place] [address] Phone: [phone]   YOUR BP [place] ARE ELEVATED, LIKELY DUE TO ACUTE PAIN AND THE PREDNISONE ALSO [place]. OK TO STOP PREDNISONE NOW. HOPEFULLY STOPPING THIS AND GETTING PAIN UNDER BETTER CONTROL WILL LOWER BP AND HEART RATE. KEEP LOG AND FOLLOW UP WITH PCP IF CONTINUES TO BE ELEVATED OVER 140/90 FOR BP AND OVER 100 FOR HEART RATE.  Your chest x-ray notes possible atelectasis (small area of collapsed lung, likely due to not taking deep breaths) versus small pneumonia.  It is more likely that this is atelectasis since you probably have been guarding or breathing due to the pain.  But, since you do have a chronic cough, or smoker and have asthma you are at increased risk for pneumonia.  Take the azithromycin which will treat  community-acquired walking pneumonia.  Follow-up if you have any fever, worsening cough or breathing trouble.    ED Prescriptions    Medication Sig Dispense Auth. Provider   tiZANidine (ZANAFLEX) 4 MG tablet Take 1 tablet (4 mg total) by mouth every 8 (eight) hours as needed for up to 10 days for muscle spasms. 30 tablet Laurene Footman B, PA-C   naproxen sodium (ANAPROX DS) 550 MG tablet Take 1 tablet (550 mg total) by mouth 2 (two) times daily with a meal for 15 days. 30 tablet Danton Clap, PA-C   HYDROcodone-acetaminophen (NORCO/VICODIN) 5-325 MG tablet Take 1 tablet by mouth every 6 (six) hours as needed for up to 5 days. 12 tablet Laurene Footman B, PA-C   azithromycin (ZITHROMAX) 250 MG tablet Take 1 tablet (250 mg total) by mouth daily. Take first 2 tablets together, then 1 every day until finished. 6 tablet Danton Clap, PA-C     I have reviewed the PDMP during this encounter.   Danton Clap, PA-C 11/10/20 1627

## 2020-11-10 NOTE — Discharge Instructions (Addendum)
Chest x-ray is normal. This is likely continued muscle spasms and thoracic muscle strain. You may need physical therapy. You should avoid any lifting >15 lbs and no over head lifting and limit overhead reaching until improving.   BACK PAIN: Stressed avoiding painful activities . RICE (REST, ICE, COMPRESSION, ELEVATION) guidelines reviewed. May alternate ice and heat. Consider use of muscle rubs, Salonpas patches, etc. Use medications as directed including muscle relaxers if prescribed. Take anti-inflammatory medications as prescribed or OTC NSAIDs/Tylenol.  F/u with PCP in 7-10 days for reexamination, and please feel free to call or return to the urgent care at any time for any questions or concerns you may have and we will be happy to help you!   BACK PAIN RED FLAGS: If the back pain acutely worsens or there are any red flag symptoms such as numbness/tingling, leg weakness, saddle anesthesia, or loss of bowel/bladder control, go immediately to the ER. Follow up with [place] as scheduled or sooner if the pain does not begin to resolve or if it worsens before the  follow up  You may have a condition requiring you to follow up with Orthopedics so please call one of the following office for appointment:   [facility] [address] [place], [place] [address] Phone: [phone]  [facility] [address], [place], [place] [address] Phone: [phone]   YOUR BP [place] ARE ELEVATED, LIKELY DUE TO ACUTE PAIN AND THE PREDNISONE ALSO [place]. OK TO STOP PREDNISONE NOW. HOPEFULLY STOPPING THIS AND GETTING PAIN UNDER BETTER CONTROL WILL LOWER BP AND HEART RATE. KEEP LOG AND FOLLOW UP WITH PCP IF CONTINUES TO BE ELEVATED OVER 140/90 FOR BP AND OVER 100 FOR HEART RATE.  Your chest x-ray notes possible atelectasis (small area of collapsed lung, likely due to not taking deep breaths) versus small pneumonia.  It is more likely that this is atelectasis since you probably have been guarding  or breathing due to the pain.  But, since you do have a chronic cough, or smoker and have asthma you are at increased risk for pneumonia.  Take the azithromycin which will treat community-acquired walking pneumonia.  Follow-up if you have any fever, worsening cough or breathing trouble.

## 2020-12-18 ENCOUNTER — Other Ambulatory Visit: Payer: Self-pay | Admitting: Family Medicine

## 2021-09-05 ENCOUNTER — Ambulatory Visit
Admission: EM | Admit: 2021-09-05 | Discharge: 2021-09-05 | Disposition: A | Discharge status: Home / Self Care | Payer: Medicaid Other | Attending: Physician Assistant | Admitting: Physician Assistant

## 2021-09-05 DIAGNOSIS — R0981 Nasal congestion: Secondary | ICD-10-CM | POA: Diagnosis present

## 2021-09-05 DIAGNOSIS — M545 Low back pain, unspecified: Secondary | ICD-10-CM | POA: Diagnosis present

## 2021-09-05 DIAGNOSIS — J209 Acute bronchitis, unspecified: Secondary | ICD-10-CM | POA: Diagnosis present

## 2021-09-05 LAB — URINALYSIS, COMPLETE (UACMP) WITH MICROSCOPIC
Bacteria, UA: NONE SEEN
Bilirubin Urine: NEGATIVE
Glucose, UA: NEGATIVE mg/dL
Hgb urine dipstick: NEGATIVE
Ketones, ur: NEGATIVE mg/dL
Leukocytes,Ua: NEGATIVE
Nitrite: NEGATIVE
Protein, ur: NEGATIVE mg/dL
Specific Gravity, Urine: 1.02 (ref 1.005–1.030)
pH: 5.5 (ref 5.0–8.0)

## 2021-09-05 MED ORDER — CLARITHROMYCIN 500 MG PO TABS: 500.0000 mg | ORAL_TABLET | Freq: Two times a day (BID) | ORAL | 0 refills | Status: DC

## 2021-09-05 MED ORDER — CYCLOBENZAPRINE HCL 10 MG PO TABS: 10.0000 mg | ORAL_TABLET | Freq: Three times a day (TID) | ORAL | 0 refills | Status: DC

## 2021-09-05 NOTE — Discharge Instructions (Signed)
You may take Ibuprofen or Aleve three times a day for 5-7 days for pain and inflammation Use heat on area of pain for 20 minutes  2-3 times a day and do stretches right after  Follow up with your primary care doctor if back pain persists after treatment.

## 2021-09-05 NOTE — ED Provider Notes (Signed)
[facility]    CSN: [id number] Arrival date & time: 09/05/21  1036      History   Chief Complaint Chief Complaint  Patient presents with   Flank Pain   Sinus Problem    HPI Kelli Morrison is a 36 y.o. female who presents with 2 complaints 1- Productive cough with green mucous and nose with green mucous x 2 weeks. Denies fever, chills or sweats. Has been having to use her inhaler more.   2- Strong urine and R flank pain x 3 days. Denies injuring her back, but has chronic back pain from DDD, but this felt different to her.     Past Medical History:  Diagnosis Date   Asthma    Hypertension     There are no problems to display for this patient.   Past Surgical History:  Procedure Laterality Date   CESAREAN SECTION     WISDOM TOOTH EXTRACTION      OB History   No obstetric history on file.      Home Medications    Prior to Admission medications   Medication Sig Start Date End Date Taking? Authorizing Provider  clarithromycin (BIAXIN) 500 MG tablet Take 1 tablet (500 mg total) by mouth 2 (two) times daily. 09/05/21  Yes Rodriguez-Southworth, Sunday Spillers, PA-C  cyclobenzaprine (FLEXERIL) 10 MG tablet Take 1 tablet (10 mg total) by mouth 3 (three) times daily. 09/05/21  Yes Rodriguez-Southworth, Sunday Spillers, PA-C  albuterol (VENTOLIN HFA) 108 (90 Base) MCG/ACT inhaler Inhale 2 puffs into the lungs every 6 (six) hours as needed. 01/31/11   [provider]    Family History Family History  Problem Relation Age of Onset   Hypertension Mother    Hypertension Father    Heart failure Maternal Grandfather     Social History Social History   Tobacco Use   Smoking status: Every Day    Packs/day: 0.50    Types: Cigarettes   Smokeless tobacco: Never  Vaping Use   Vaping Use: Never used  Substance Use Topics   Alcohol use: Yes    Comment: social   Drug use: Yes    Types: Marijuana    Comment: last use yesterday     Allergies   Patient has no  known allergies.   Review of Systems Review of Systems  Constitutional:  Negative for appetite change, chills, fatigue and fever.  HENT:  Positive for congestion, ear pain, postnasal drip, sinus pressure and sinus pain. Negative for ear discharge.   Eyes:  Negative for discharge.  Respiratory:  Positive for cough and wheezing. Negative for chest tightness and shortness of breath.   Gastrointestinal:  Negative for diarrhea, nausea and vomiting.  Genitourinary:  Positive for flank pain. Negative for dysuria, frequency and urgency.  Musculoskeletal:  Negative for myalgias.  Skin:  Negative for rash.  Neurological:  Negative for headaches.  Hematological:  Negative for adenopathy.    Physical Exam Triage Vital Signs ED Triage Vitals  Enc Vitals Group     BP 09/05/21 1204 (!) 135/93     Pulse Rate 09/05/21 1204 87     Resp 09/05/21 1204 17     Temp 09/05/21 1204 97.6 F (36.4 C)     Temp Source 09/05/21 1204 Oral     SpO2 09/05/21 1204 100 %     Weight 09/05/21 1201 245 lb (111.1 kg)     Height 09/05/21 1201 5\' 7"  (1.702 m)     Head Circumference --  Peak Flow --      Pain Score 09/05/21 1201 4     Pain Loc --      Pain Edu? --      Excl. in [place]? --    No data found.  Updated Vital Signs BP (!) 135/93 (BP Location: Right Arm)    Pulse 87    Temp 97.6 F (36.4 C) (Oral)    Resp 17    Ht 5\' 7"  (1.702 m)    Wt 245 lb (111.1 kg)    LMP 08/10/2021    SpO2 100%    BMI 38.37 kg/m   Visual Acuity Right Eye Distance:   Left Eye Distance:   Bilateral Distance:    Right Eye Near:   Left Eye Near:    Bilateral Near:     Physical Exam Neurological:     Deep Tendon Reflexes: Reflexes normal.   Physical Exam Vitals signs and nursing note reviewed.  Constitutional:      General: She is not in acute distress.    Appearance: Normal appearance. She is not ill-appearing, toxic-appearing or diaphoretic.  HENT:     Head: Normocephalic.     Right Ear: Tympanic membrane, ear  canal and external ear normal.     Left Ear: Tympanic membrane, ear canal and external ear normal.     Nose: has moderate congestion, and her maxillary and ethmoid sinuses are tender.     Mouth/Throat: clear    Mouth: Mucous membranes are moist.  Eyes:     General: No scleral icterus.       Right eye: No discharge.        Left eye: No discharge.     Conjunctiva/sclera: Conjunctivae normal.  Neck:     Musculoskeletal: Neck supple. No neck rigidity.  Cardiovascular:     Rate and Rhythm: Normal rate and regular rhythm.     Heart sounds: No murmur.  Pulmonary:     Effort: Pulmonary effort is normal.     Breath sounds: Normal breath sounds.   Musculoskeletal: Normal range of motion. Has local tenderness on R mid lumbar region, close to her facets. Pain provoked with lateral flexion on both sides, posterior extension and flexion to 90 degrees. SLR neg.  Lymphadenopathy:     Cervical: No cervical adenopathy.  Skin:    General: Skin is warm and dry.     Coloration: Skin is not jaundiced.     Findings: No rash.  Neurological:     Mental Status: She is alert and oriented to person, place, and time.     Gait: Gait normal.  Psychiatric:        Mood and Affect: Mood normal.        Behavior: Behavior normal.        Thought Content: Thought content normal.        Judgment: Judgment normal.    UC Treatments / Results  Labs (all labs ordered are listed, but only abnormal results are displayed) Labs Reviewed  URINALYSIS, COMPLETE (UACMP) WITH MICROSCOPIC  UA is neg.   EKG   Radiology No results found.  Procedures Procedures (including critical care time)  Medications Ordered in UC Medications - No data to display  Initial Impression / Assessment and Plan / UC Course  I have reviewed the triage vital signs and the nursing notes. Pertinent labs  results that were available during my care of the patient were reviewed by me and considered in my medical decision making (see chart  for details). Bronchitis and Sinusitis  R lumbar muscular back pain I placed her on Biaxen, advised her to do saline nose rinses bid for a few days and also placed her on Flexeril.    Final Clinical Impressions(s) / UC Diagnoses   Final diagnoses:  Pain in right lumbar region of back  Acute bronchitis, unspecified organism  Nose congestion     Discharge Instructions      You may take Ibuprofen or Aleve three times a day for 5-7 days for pain and inflammation Use heat on area of pain for 20 minutes  2-3 times a day and do stretches right after  Follow up with your primary care doctor if back pain persists after treatment.      ED Prescriptions     Medication Sig Dispense Auth. Provider   clarithromycin (BIAXIN) 500 MG tablet Take 1 tablet (500 mg total) by mouth 2 (two) times daily. 14 tablet Rodriguez-Southworth, Enrika Aguado, PA-C   cyclobenzaprine (FLEXERIL) 10 MG tablet Take 1 tablet (10 mg total) by mouth 3 (three) times daily. 20 tablet Rodriguez-Southworth, Sunday Spillers, PA-C      PDMP not reviewed this encounter.   Shelby Mattocks, [place] 09/05/21 1743

## 2021-09-05 NOTE — ED Triage Notes (Signed)
Pt reports coughing up green phlegm with blood and blowing green secretions from her nose. Also with right flank pain and strong smelling urine.

## 2021-12-11 ENCOUNTER — Ambulatory Visit (INDEPENDENT_AMBULATORY_CARE_PROVIDER_SITE_OTHER): Payer: Medicaid Other

## 2021-12-11 ENCOUNTER — Encounter: Payer: Self-pay | Admitting: Emergency Medicine

## 2021-12-11 ENCOUNTER — Ambulatory Visit
Admission: EM | Admit: 2021-12-11 | Discharge: 2021-12-11 | Disposition: A | Discharge status: Home / Self Care | Payer: Medicaid Other | Attending: Internal Medicine | Admitting: Internal Medicine

## 2021-12-11 ENCOUNTER — Other Ambulatory Visit: Payer: Self-pay

## 2021-12-11 DIAGNOSIS — J4541 Moderate persistent asthma with (acute) exacerbation: Secondary | ICD-10-CM | POA: Insufficient documentation

## 2021-12-11 DIAGNOSIS — K567 Ileus, unspecified: Secondary | ICD-10-CM | POA: Diagnosis present

## 2021-12-11 DIAGNOSIS — R1011 Right upper quadrant pain: Secondary | ICD-10-CM | POA: Insufficient documentation

## 2021-12-11 LAB — URINALYSIS, ROUTINE W REFLEX MICROSCOPIC
Bilirubin Urine: NEGATIVE
Glucose, UA: NEGATIVE mg/dL
Hgb urine dipstick: NEGATIVE
Ketones, ur: NEGATIVE mg/dL
Leukocytes,Ua: NEGATIVE
Nitrite: NEGATIVE
Protein, ur: NEGATIVE mg/dL
Specific Gravity, Urine: 1.01 (ref 1.005–1.030)
pH: 6 (ref 5.0–8.0)

## 2021-12-11 MED ORDER — FLUTICASONE-SALMETEROL 250-50 MCG/ACT IN AEPB: 1.0000 | INHALATION_SPRAY | Freq: Two times a day (BID) | RESPIRATORY_TRACT | 0 refills | Status: DC

## 2021-12-11 MED ORDER — ALBUTEROL SULFATE HFA 108 (90 BASE) MCG/ACT IN AERS: 2.0000 | INHALATION_SPRAY | Freq: Four times a day (QID) | RESPIRATORY_TRACT | 0 refills | Status: AC | PRN

## 2021-12-11 MED ORDER — METHYLPREDNISOLONE 4 MG PO TBPK: ORAL_TABLET | ORAL | 0 refills | Status: DC

## 2021-12-11 NOTE — ED Provider Notes (Signed)
?[facility] ? ? ? ?CSN: [id number] ?Arrival date & time: 12/11/21  1154 ? ? ?  ? ?History   ?Chief Complaint ?Chief Complaint  ?Patient presents with  ? Abdominal Pain  ? Wheezing  ? ? ?HPI ?Kelli Morrison is a 36 y.o. female presents with 2 complaints ?1-  wheezing off and on x 1 month. Expised to dogs from new relative that has moved with mother ? ?2- intermittent RUQ pain since this am. Starts on mid R abdomen pain moves to RUQ, is sharp. Comes in waves. Took gas ex and feels it helped a little. Has mild nausea. Denies constipation. Has not had fever. No nausea.  ? ? ?Past Medical History:  ?Diagnosis Date  ? Asthma   ? Hypertension   ? ? ?There are no problems to display for this patient. ? ? ?Past Surgical History:  ?Procedure Laterality Date  ? CESAREAN SECTION    ? WISDOM TOOTH EXTRACTION    ? ? ?OB History   ?No obstetric history on file. ?  ? ? ? ?Home Medications   ? ?Prior to Admission medications   ?Medication Sig Start Date End Date Taking? Authorizing Provider  ?cetirizine (ZYRTEC) 10 MG tablet Take 10 mg by mouth daily.   Yes [provider]  ?fluticasone-salmeterol (ADVAIR DISKUS) 250-50 MCG/ACT AEPB Inhale 1 puff into the lungs in the morning and at bedtime. 12/11/21  Yes Rodriguez-Southworth, Sunday Spillers, PA-C  ?methylPREDNISolone (MEDROL DOSEPAK) 4 MG TBPK tablet Take as directed per pack 12/11/21  Yes Rodriguez-Southworth, Sunday Spillers, PA-C  ?Multiple Vitamin (MULTIVITAMIN) tablet Take 1 tablet by mouth daily.   Yes [provider]  ?albuterol (VENTOLIN HFA) 108 (90 Base) MCG/ACT inhaler Inhale 2 puffs into the lungs every 6 (six) hours as needed. 12/11/21   Rodriguez-Southworth, Sunday Spillers, PA-C  ? ? ?Family History ?Family History  ?Problem Relation Age of Onset  ? Hypertension Mother   ? Hypertension Father   ? Heart failure Maternal Grandfather   ? ? ?Social History ?Social History  ? ?Tobacco Use  ? Smoking status: Every Day  ?  Packs/day: 0.50  ?  Types: Cigarettes  ?  Smokeless tobacco: Never  ?Vaping Use  ? Vaping Use: Never used  ?Substance Use Topics  ? Alcohol use: Yes  ?  Comment: social  ? Drug use: Yes  ?  Types: Marijuana  ?  Comment: last use yesterday  ? ? ? ?Allergies   ?Patient has no known allergies. ? ? ?Review of Systems ?Review of Systems  ?Constitutional:  Negative for diaphoresis and fever.  ?HENT:  Negative for congestion, ear discharge, ear pain and rhinorrhea.   ?Respiratory:  Positive for cough and wheezing.   ?Gastrointestinal:  Positive for abdominal pain. Negative for constipation, diarrhea, nausea and vomiting.  ?Genitourinary:  Positive for pelvic pain and vaginal bleeding. Negative for difficulty urinating, dysuria, frequency and urgency.  ?     Is on her period  ?Skin:  Negative for rash.  ?Hematological:  Negative for adenopathy.  ? ? ?Physical Exam ?Triage Vital Signs ?ED Triage Vitals  ?Enc Vitals Group  ?   BP 12/11/21 1241 (!) 149/100  ?   Pulse Rate 12/11/21 1241 81  ?   Resp 12/11/21 1241 18  ?   Temp 12/11/21 1241 97.7 ?F (36.5 ?C)  ?   Temp Source 12/11/21 1241 Oral  ?   SpO2 12/11/21 1241 100 %  ?   Weight 12/11/21 1239 244 lb 14.9 oz (111.1 kg)  ?  Height 12/11/21 1239 '5\' 7"'$  (1.702 m)  ?   Head Circumference --   ?   Peak Flow --   ?   Pain Score 12/11/21 1238 3  ?   Pain Loc --   ?   Pain Edu? --   ?   Excl. in [place]? --   ? ?No data found. ? ?Updated Vital Signs ?BP (!) 149/100 (BP Location: Right Arm)   Pulse 81   Temp 97.7 ?F (36.5 ?C) (Oral)   Resp 18   Ht '5\' 7"'$  (1.702 m)   Wt 244 lb 14.9 oz (111.1 kg)   LMP 12/06/2021 (Approximate) Comment: denies preg, signed waiver  SpO2 100%   BMI 38.36 kg/m?  ? ?Visual Acuity ?Right Eye Distance:   ?Left Eye Distance:   ?Bilateral Distance:   ? ?Right Eye Near:   ?Left Eye Near:    ?Bilateral Near:    ? ?Physical Exam ?Vitals and nursing note reviewed.  ?Constitutional:   ?   General: She is not in acute distress. ?   Appearance: She is obese. She is not toxic-appearing.  ?HENT:  ?   Head:  Normocephalic.  ?   Right Ear: External ear normal.  ?   Left Ear: External ear normal.  ?   Mouth/Throat:  ?   Mouth: Mucous membranes are moist.  ?Eyes:  ?   General: No scleral icterus. ?   Conjunctiva/sclera: Conjunctivae normal.  ?Cardiovascular:  ?   Rate and Rhythm: Normal rate and regular rhythm.  ?   Heart sounds: No murmur heard. ?Pulmonary:  ?   Effort: Pulmonary effort is normal.  ?   Breath sounds: Wheezing present.  ?Abdominal:  ?   General: Abdomen is scaphoid. Bowel sounds are normal. There is no distension.  ?   Palpations: Abdomen is soft. There is no mass.  ?   Tenderness: There is abdominal tenderness. There is no right CVA tenderness, left CVA tenderness, guarding or rebound. Negative signs include Murphy's sign and psoas sign.  ?   Hernia: No hernia is present.  ?   Comments: Has mod tenderness on R mid abdomen.   ?Musculoskeletal:     ?   General: Normal range of motion.  ?   Cervical back: Neck supple.  ?Skin: ?   General: Skin is warm and dry.  ?   Findings: No rash.  ?Neurological:  ?   Mental Status: She is alert and oriented to person, place, and time.  ?   Gait: Gait normal.  ?Psychiatric:     ?   Mood and Affect: Mood normal.     ?   Behavior: Behavior normal.     ?   Thought Content: Thought content normal.     ?   Judgment: Judgment normal.  ? ? ? ?UC Treatments / Results  ?Labs ?(all labs ordered are listed, but only abnormal results are displayed) ?Labs Reviewed  ?URINALYSIS, ROUTINE W REFLEX MICROSCOPIC  ?UA negative  ? ?EKG ? ? ?Radiology ?DG Abd 2 Views ? ?Result Date: 12/11/2021 ?CLINICAL DATA:  Right-sided abdominal pain. EXAM: ABDOMEN - 2 VIEW COMPARISON:  None. FINDINGS: Mildly prominent gas-filled loops of small bowel in the left abdomen may relate to mild focal ileus or enteritis. There are some scattered air-fluid levels on the upright film. No evidence of free intraperitoneal air. No abnormal calcifications identified. Visualized bony structures are unremarkable.  IMPRESSION: Possible mild ileus/enteritis involving left-sided small bowel loops. Electronically Signed  By: Aletta Edouard M.D.   On: 12/11/2021 14:37   ? ?Procedures ?Procedures (including critical care time) ? ?Medications Ordered in UC ?Medications - No data to display ? ?Initial Impression / Assessment and Plan / UC Course  ?I have reviewed the triage vital signs and the nursing notes. ? ?Pertinent  imaging results that were available during my care of the patient were reviewed by me and considered in my medical decision making (see chart for details). ? ?She will try gas-ex and if her symptoms persist, will go to ER.  ? ?Upon looking closer with the xray tech, the circular mass is her kidney, and I called pt to inform her of that.  ? ?For her Asthma exacerbation I placed her on Medrol dose pack, refilled her albuterol and Advair which she has had to take in the past.  ? ? ?Final Clinical Impressions(s) / UC Diagnoses  ? ?Final diagnoses:  ?Moderate persistent asthma with exacerbation  ?Right upper quadrant abdominal pain  ?Ileus ([place])  ? ? ? ?Discharge Instructions   ? ?  ?Go to the ER to have a Cat scan done, since the area of pain, in the plain xray shows a circular area like a mass, that needs further investigation  ? ? ? ? ?ED Prescriptions   ? ? Medication Sig Dispense Auth. Provider  ? fluticasone-salmeterol (ADVAIR DISKUS) 250-50 MCG/ACT AEPB Inhale 1 puff into the lungs in the morning and at bedtime. 1 each Rodriguez-Southworth, Sunday Spillers, PA-C  ? methylPREDNISolone (MEDROL DOSEPAK) 4 MG TBPK tablet Take as directed per pack 21 tablet Rodriguez-Southworth, Sunday Spillers, PA-C  ? albuterol (VENTOLIN HFA) 108 (90 Base) MCG/ACT inhaler Inhale 2 puffs into the lungs every 6 (six) hours as needed. 18 g Rodriguez-Southworth, Sunday Spillers, PA-C  ? ?  ? ?PDMP not reviewed this encounter. ?  ?Shelby Mattocks, PA-C ?12/11/21 1858 ? ?

## 2021-12-11 NOTE — ED Notes (Signed)
Patient is being discharged from the Urgent Care and sent to the Emergency Department via POV . Per [facility] Southworth-Rodrigez,PA, patient is in need of higher level of care due to RUQ pain. Patient is aware and verbalizes understanding of plan of care.  ?Vitals:  ? 12/11/21 1241  ?BP: (!) 149/100  ?Pulse: 81  ?Resp: 18  ?Temp: 97.7 ?F (36.5 ?C)  ?SpO2: 100%  ? ? ?

## 2021-12-11 NOTE — Discharge Instructions (Addendum)
Go to the ER to have a Cat scan done, since the area of pain, in the plain xray shows a circular area like a mass, that needs further investigation  ?

## 2021-12-11 NOTE — ED Triage Notes (Signed)
Pt c/o RUQ pain. She states it is intermittent. Started this morning. She states she took gasx and felt a little better. She states she was slightly nauseous earlier but no vomiting and reports a loose stool but no diarrhea. Denies fever. She states she is on her menstrual cycle and is usually painful. She is also c/o wheezing. Denies urinary symptoms.  ?

## 2022-01-24 IMAGING — CR DG CHEST 2V
2 series · 2 of 2 positions shown · non-contrast
Comparison: 02/11/2015

CLINICAL DATA: Left chest and back pain

EXAM:
CHEST - 2 VIEW

[chest lat]
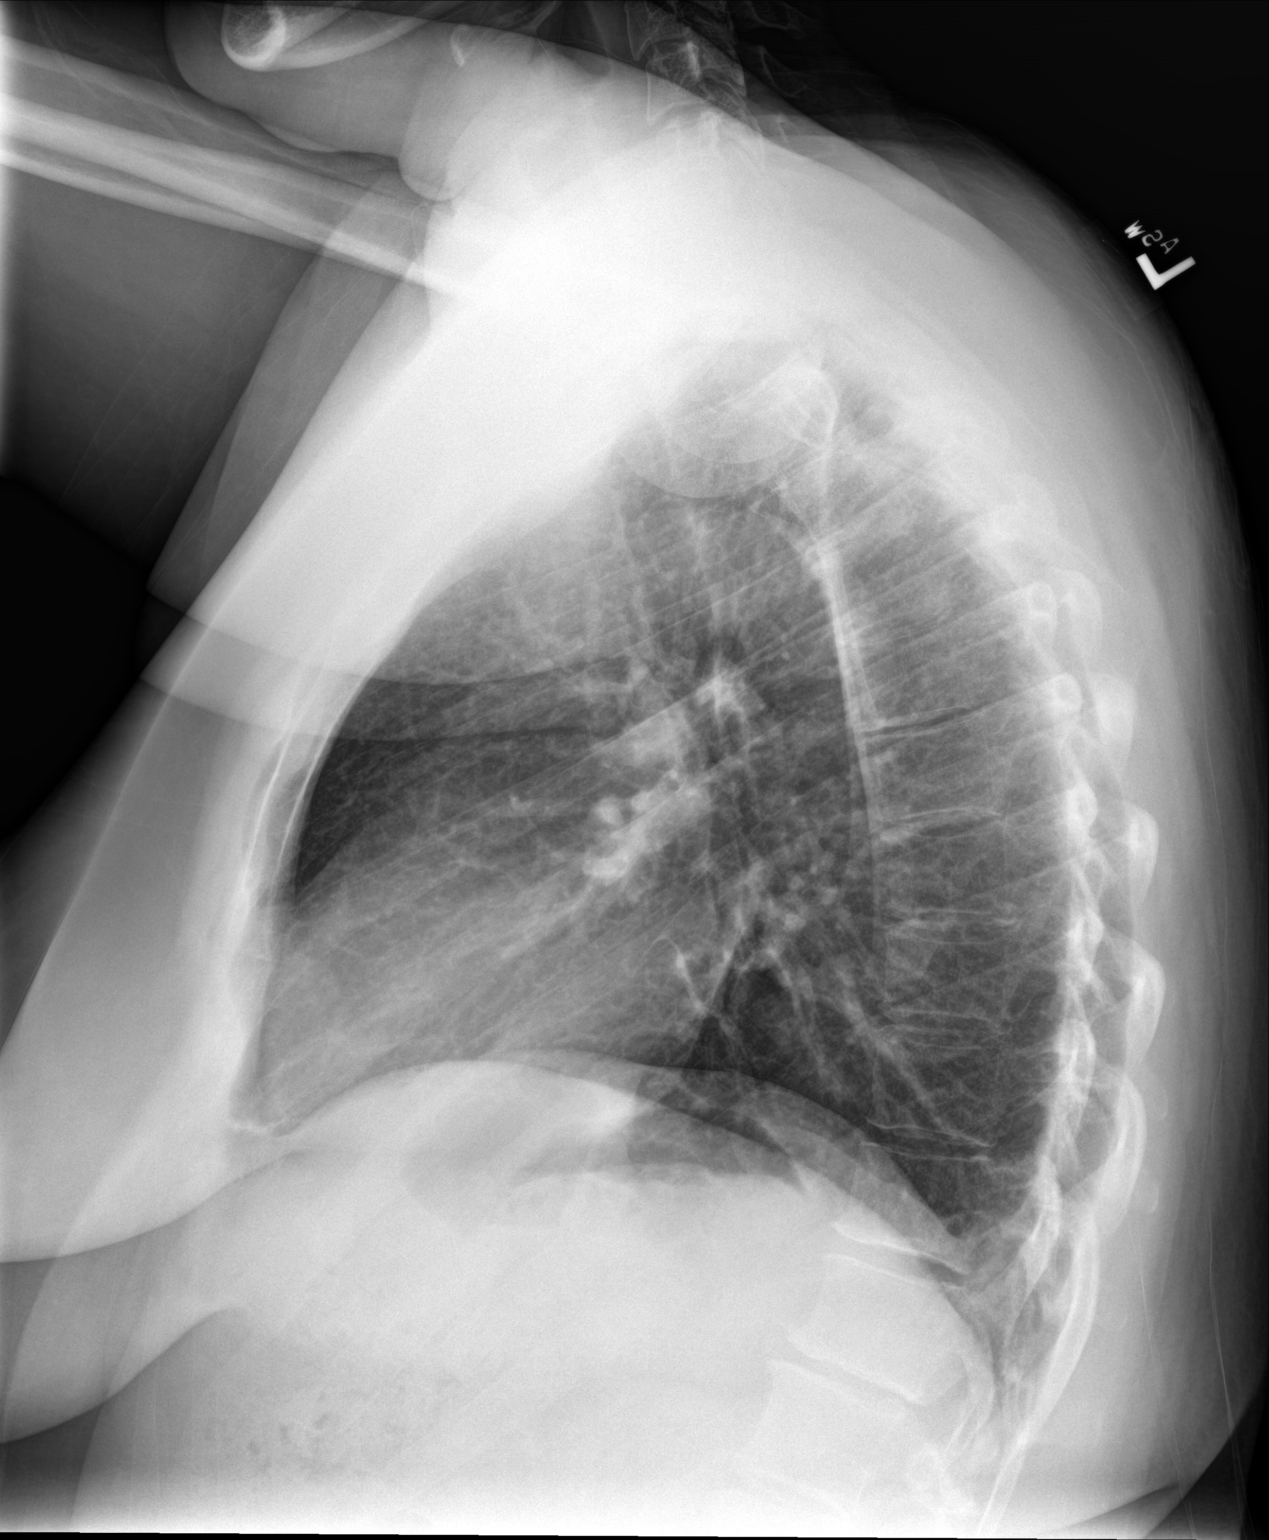

[chest pa]
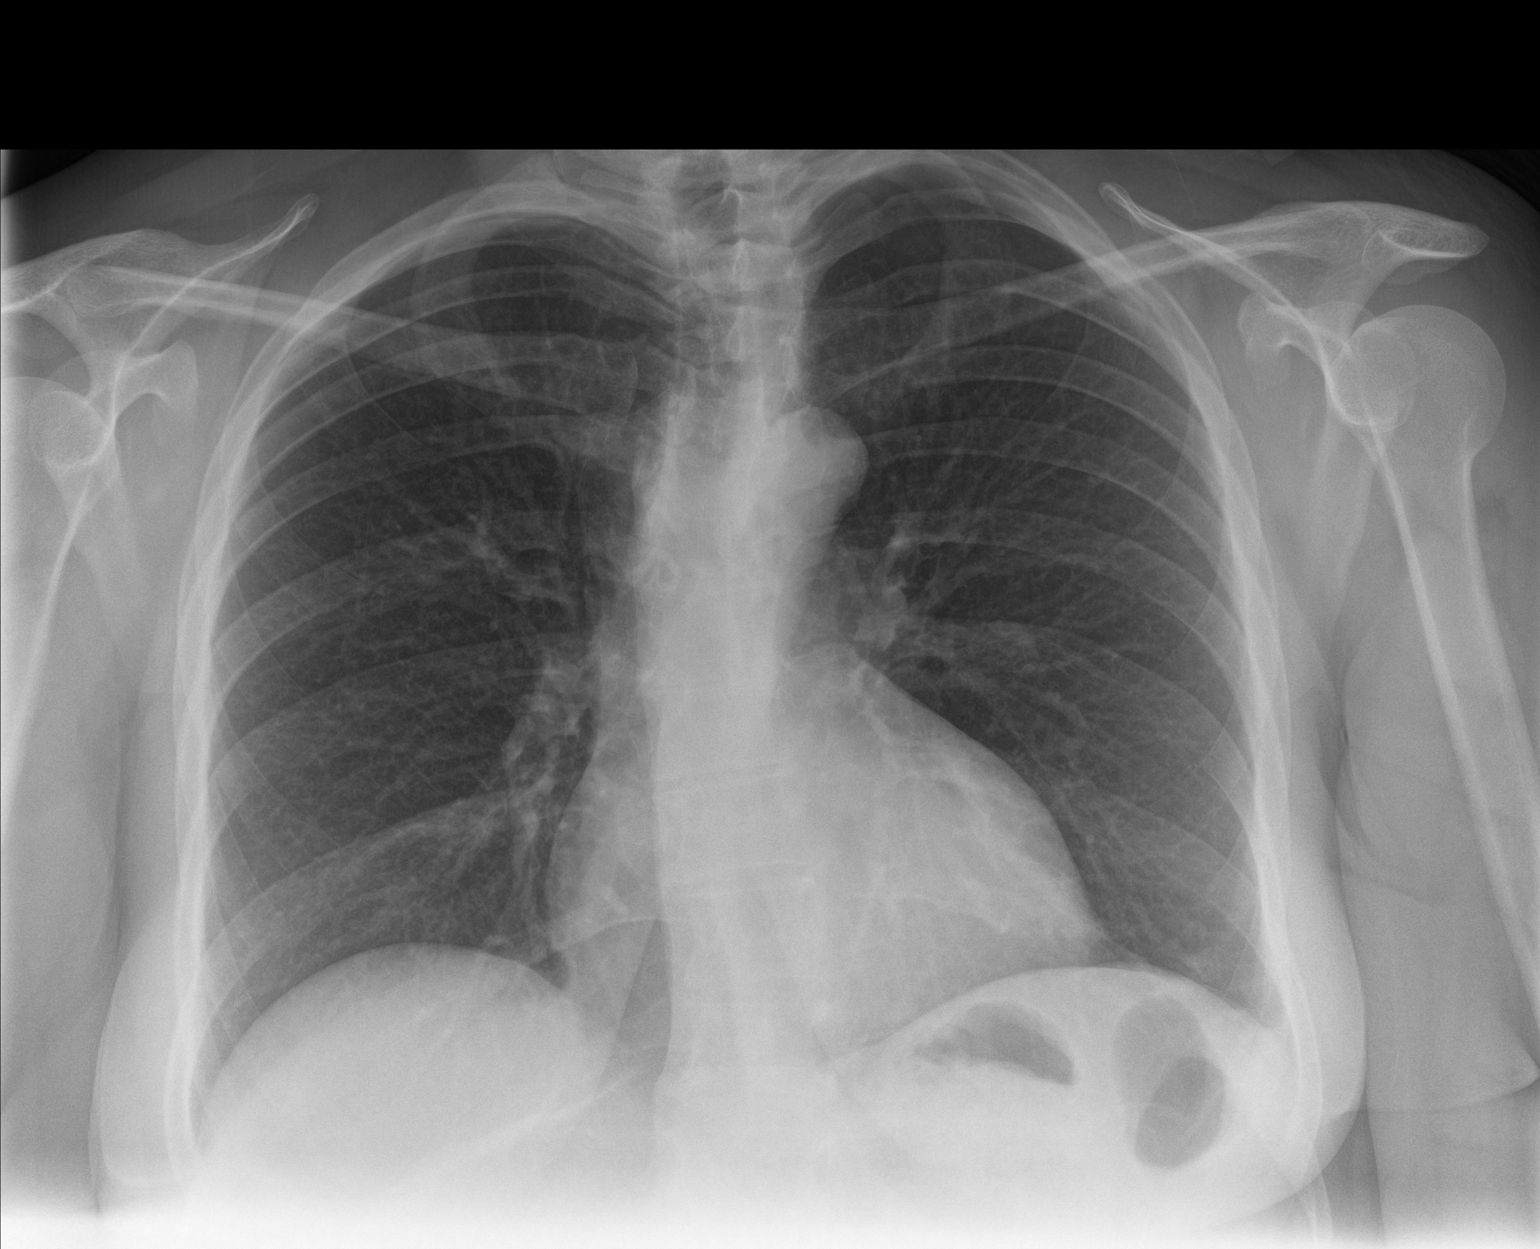

[2 of 2 positions shown; findings below may reference images not displayed]

FINDINGS: Heart size is normal. Right chest is clear. There is mild blunting
of the left lateral costophrenic angle and mild patchy density at
the left lung base. Findings could be due to atelectasis and or
pneumonia with a small amount of pleural fluid. Mild chronic
curvature of the spine again demonstrated. No rib abnormality seen.
IMPRESSION: Mild patchy density at the left base with some pleural density.
Findings could be due to atelectasis and or pneumonia with a small
amount of pleural fluid.

## 2022-04-05 ENCOUNTER — Ambulatory Visit
Admission: RE | Admit: 2022-04-05 | Discharge: 2022-04-05 | Disposition: A | Discharge status: Home / Self Care | Payer: Medicaid Other | Source: Ambulatory Visit | Attending: Emergency Medicine | Admitting: Emergency Medicine

## 2022-04-05 VITALS — BP 156/105 | HR 82 | Temp 98.1°F | Resp 16

## 2022-04-05 DIAGNOSIS — N920 Excessive and frequent menstruation with regular cycle: Secondary | ICD-10-CM

## 2022-04-05 LAB — CBC WITH DIFFERENTIAL/PLATELET
Abs Immature Granulocytes: 0.03 10*3/uL (ref 0.00–0.07)
Basophils Absolute: 0.1 10*3/uL (ref 0.0–0.1)
Basophils Relative: 1 %
Eosinophils Absolute: 0.5 10*3/uL (ref 0.0–0.5)
Eosinophils Relative: 5 %
HCT: 36.7 % (ref 36.0–46.0)
Hemoglobin: 11.7 g/dL — ABNORMAL LOW (ref 12.0–15.0)
Immature Granulocytes: 0 %
Lymphocytes Relative: 31 %
Lymphs Abs: 3.2 10*3/uL (ref 0.7–4.0)
MCH: 29.4 pg (ref 26.0–34.0)
MCHC: 31.9 g/dL (ref 30.0–36.0)
MCV: 92.2 fL (ref 80.0–100.0)
Monocytes Absolute: 0.7 10*3/uL (ref 0.1–1.0)
Monocytes Relative: 7 %
Neutro Abs: 5.7 10*3/uL (ref 1.7–7.7)
Neutrophils Relative %: 56 %
Platelets: 340 10*3/uL (ref 150–400)
RBC: 3.98 MIL/uL (ref 3.87–5.11)
RDW: 17.1 % — ABNORMAL HIGH (ref 11.5–15.5)
WBC: 10.1 10*3/uL (ref 4.0–10.5)
nRBC: 0 % (ref 0.0–0.2)

## 2022-04-05 MED ORDER — TRAMADOL HCL 50 MG PO TABS: 50.0000 mg | ORAL_TABLET | Freq: Four times a day (QID) | ORAL | 0 refills | Status: DC | PRN

## 2022-04-05 NOTE — ED Triage Notes (Signed)
Pt presents with heavy menstrual bleeding that started yesterday.

## 2022-04-05 NOTE — ED Provider Notes (Addendum)
[facility]    CSN: [id number] Arrival date & time: 04/05/22  1704      History   Chief Complaint Chief Complaint  Patient presents with   Vaginal Bleeding    HPI Kelli Morrison is a 36 y.o. female.   HPI  36 year old female here for evaluation of vaginal bleeding.  Patient reports that she began her period today and she has been experiencing heavy vaginal bleeding changing 1 super tampon every 1-2 hours.  She is passing clots and has had some nausea.  She states that this has been going on for over a year.  Her typical menstrual cycle last 7 to 12 days with the first 6 being heavy bleeding.  She denies any dizziness or syncope.  She did contact family medicine today and was advised to go to the emergency department for blood work to look for anemia.  They have referred her to [place].  Past Medical History:  Diagnosis Date   Asthma    Hypertension     There are no problems to display for this patient.   Past Surgical History:  Procedure Laterality Date   CESAREAN SECTION     WISDOM TOOTH EXTRACTION      OB History   No obstetric history on file.      Home Medications    Prior to Admission medications   Medication Sig Start Date End Date Taking? Authorizing Provider  albuterol (VENTOLIN HFA) 108 (90 Base) MCG/ACT inhaler Inhale 2 puffs into the lungs every 6 (six) hours as needed. 12/11/21  Yes Rodriguez-Southworth, Sunday Spillers, PA-C  cetirizine (ZYRTEC) 10 MG tablet Take 10 mg by mouth daily.   Yes [provider]  traMADol (ULTRAM) 50 MG tablet Take 1 tablet (50 mg total) by mouth every 6 (six) hours as needed. 04/05/22  Yes Margarette Canada, NP  fluticasone-salmeterol (ADVAIR DISKUS) 250-50 MCG/ACT AEPB Inhale 1 puff into the lungs in the morning and at bedtime. 12/11/21   Rodriguez-Southworth, Sunday Spillers, PA-C  methylPREDNISolone (MEDROL DOSEPAK) 4 MG TBPK tablet Take as directed per pack 12/11/21   Rodriguez-Southworth, Sunday Spillers, PA-C  Multiple  Vitamin (MULTIVITAMIN) tablet Take 1 tablet by mouth daily.    [provider]    Family History Family History  Problem Relation Age of Onset   Hypertension Mother    Hypertension Father    Heart failure Maternal Grandfather     Social History Social History   Tobacco Use   Smoking status: Every Day    Packs/day: 0.50    Types: Cigarettes   Smokeless tobacco: Never  Vaping Use   Vaping Use: Never used  Substance Use Topics   Alcohol use: Yes    Comment: social   Drug use: Yes    Types: Marijuana    Comment: last use yesterday     Allergies   Patient has no known allergies.   Review of Systems Review of Systems  Gastrointestinal:  Positive for abdominal pain.  Genitourinary:  Positive for vaginal bleeding. Negative for vaginal pain.  Neurological:  Negative for dizziness and syncope.     Physical Exam Triage Vital Signs ED Triage Vitals  Enc Vitals Group     BP 04/05/22 1714 (!) 156/105     Pulse Rate 04/05/22 1714 82     Resp 04/05/22 1714 16     Temp 04/05/22 1714 98.1 F (36.7 C)     Temp Source 04/05/22 1714 Oral     SpO2 04/05/22 1714 100 %  Weight --      Height --      Head Circumference --      Peak Flow --      Pain Score 04/05/22 1713 8     Pain Loc --      Pain Edu? --      Excl. in [place]? --    No data found.  Updated Vital Signs BP (!) 156/105 (BP Location: Right Wrist)   Pulse 82   Temp 98.1 F (36.7 C) (Oral)   Resp 16   LMP 04/04/2022   SpO2 100%   Visual Acuity Right Eye Distance:   Left Eye Distance:   Bilateral Distance:    Right Eye Near:   Left Eye Near:    Bilateral Near:     Physical Exam Vitals and nursing note reviewed.  Constitutional:      Appearance: Normal appearance. She is not ill-appearing.  HENT:     Head: Normocephalic and atraumatic.     Mouth/Throat:     Mouth: Mucous membranes are moist.     Pharynx: Oropharynx is clear. No oropharyngeal exudate or posterior oropharyngeal erythema.   Eyes:     Conjunctiva/sclera: Conjunctivae normal.  Cardiovascular:     Rate and Rhythm: Normal rate and regular rhythm.     Pulses: Normal pulses.     Heart sounds: Normal heart sounds. No murmur heard.    No friction rub. No gallop.  Pulmonary:     Effort: Pulmonary effort is normal.     Breath sounds: Normal breath sounds. No wheezing, rhonchi or rales.  Skin:    General: Skin is warm and dry.     Capillary Refill: Capillary refill takes less than 2 seconds.     Findings: No erythema or rash.  Neurological:     General: No focal deficit present.     Mental Status: She is alert and oriented to person, place, and time.  Psychiatric:        Mood and Affect: Mood normal.        Behavior: Behavior normal.        Thought Content: Thought content normal.        Judgment: Judgment normal.      UC Treatments / Results  Labs (all labs ordered are listed, but only abnormal results are displayed) Labs Reviewed  CBC WITH DIFFERENTIAL/PLATELET - Abnormal; Notable for the following components:      Result Value   Hemoglobin 11.7 (*)    RDW 17.1 (*)    All other components within normal limits    EKG   Radiology No results found.  Procedures Procedures (including critical care time)  Medications Ordered in UC Medications - No data to display  Initial Impression / Assessment and Plan / UC Course  I have reviewed the triage vital signs and the nursing notes.  Pertinent labs & imaging results that were available during my care of the patient were reviewed by me and considered in my medical decision making (see chart for details).  Patient is a very pleasant, nontoxic-appearing 36 year old female here for evaluation of heavy vaginal bleeding that started today.  She states that this is her normal cycle and that for the last year or more she has had heavy vaginal bleeding with each menstrual cycle.  Her cycle typically last 7 to 12 days with the first 6 days being heavy  bleeding.  She is using 1 superabsorbent tampon and changing it every 1-2 hours.  She is  passing clots and she endorses some nausea.  She denies any syncope or dizziness.  She did contact her PCP today who advised her to go to the ER for blood work to look for anemia and they have referred her to [place].  Patient's physical exam reveals mildly pale conjunctiva bilaterally.  Oral mucous membranes are likewise mildly pale.  Cardiopulmonary exam reveals S1-S2 heart sounds with regular rate and rhythm and lung sounds that are clear to auscultation all fields.  I will order CBC to look for signs of anemia.  If patient is not anemic will refer her to [place].  She is taking high-dose ibuprofen and Midol at present.  I will encourage her to continue that as well.  CBC shows hemoglobin of 11.7 and hematocrit 36.7.  Overall blood count is 3.98.  Platelets are 340.  No evidence of anemia.  I will discharge patient home with a diagnosis of menorrhagia with normal menstrual cycle.  I will refer her to [place] for management of her menorrhagia with irregular cycle.  I will also discharge her home with a short prescription of tramadol that she can use for her severe cramping.   Final Clinical Impressions(s) / UC Diagnoses   Final diagnoses:  Menorrhagia with regular cycle     Discharge Instructions      Your blood work today did not show any signs of anemia.  I have made referral to [place].  They will call you to make an appointment to address your heavy vaginal bleeding.  Continue to take ibuprofen 600 milligrams every 6 hours to help with the bleeding and pain.  I will give you some tramadol that she can use for severe pain and at nighttime to help you get some sleep.     ED Prescriptions     Medication Sig Dispense Auth. Provider   traMADol (ULTRAM) 50 MG tablet Take 1 tablet (50 mg total) by mouth every 6 (six) hours as needed. 15 tablet Margarette Canada, NP      I  have reviewed the PDMP during this encounter.   Margarette Canada, NP 04/05/22 1757    Margarette Canada, NP 04/05/22 1759

## 2022-04-05 NOTE — Discharge Instructions (Addendum)
Your blood work today did not show any signs of anemia.  I have made referral to [place].  They will call you to make an appointment to address your heavy vaginal bleeding.  Continue to take ibuprofen 600 milligrams every 6 hours to help with the bleeding and pain.  I will give you some tramadol that she can use for severe pain and at nighttime to help you get some sleep.

## 2022-04-05 NOTE — ED Notes (Addendum)
Erroneous entry

## 2022-04-25 ENCOUNTER — Encounter: Payer: Self-pay | Admitting: Advanced Practice Midwife

## 2022-07-05 ENCOUNTER — Other Ambulatory Visit: Payer: Self-pay | Admitting: Obstetrics and Gynecology

## 2022-07-10 ENCOUNTER — Other Ambulatory Visit: Payer: Self-pay | Admitting: Obstetrics and Gynecology

## 2022-07-24 NOTE — H&P (Signed)
Kelli Morrison is a 36 y.o. female here for Discuss hysterectomy .   Pt here to rediscuss her menorrhagia and tx options  U/S :  Fibroid ut 1 mid fib ant to endometrium= 5.02 cm      Endometrium=10.56 mm   bil ovs wnl   EMBX : neg  Pap : wnl     Menses have been worsening over the years now lasting upto 3 weeks . ++ dysmenorrhea Not sexually active   She has tried [place] without much benefit . She smokes 1ppd . S/p c/s x1    Past Medical History:  has a past medical history of Anxiety, Asthma, unspecified asthma severity, unspecified whether complicated, unspecified whether persistent (1995), and Depression.  Past Surgical History:  has a past surgical history that includes Cesarean section (10/24/2007). Family History: family history includes Diabetes in her maternal grandmother; Heart disease in her paternal grandmother; High blood pressure (Hypertension) in her father and maternal grandmother; Hip fracture in her maternal grandmother. Social History:  reports that she has been smoking cigarettes. She has never used smokeless tobacco. She reports current alcohol use. She reports that she does not currently use drugs. OB/GYN History:  OB History       Gravida  1   Para  1   Term  1   Preterm      AB      Living  1        SAB      IAB      Ectopic      Molar      Multiple      Live Births  1             Allergies: has No Known Allergies. Medications:   Current Outpatient Medications:    albuterol (VENTOLIN HFA) 90 mcg/actuation inhaler, 2 puff by inhalation 6 times a day as needed, Disp: , Rfl: 5   budesonide-formoteroL (SYMBICORT) 80-4.5 mcg/actuation inhaler, Inhale 2 inhalations into the lungs 2 (two) times daily, Disp: , Rfl:    cetirizine (ZYRTEC) 10 MG tablet, Take 10 mg by mouth once daily, Disp: , Rfl:    clindamycin (CLEOCIN T) 1 % topical solution, Apply topically 2 (two) times daily, Disp: 60 mL, Rfl: 1   fluticasone-salmeterol (ADVAIR DISKUS)  250-50 mcg/dose diskus inhaler, Inhale 1 inhalation into the lungs 2 (two) times daily, Disp: , Rfl: 5   montelukast (SINGULAIR) 10 mg tablet, 1 tab by mouth daily, Disp: , Rfl: 1   multivitamin tablet, Take 1 tablet by mouth once daily, Disp: , Rfl:    naproxen (NAPROSYN) 500 MG tablet, Take 1 tablet (500 mg total) by mouth 2 (two) times daily with meals Take during the first 3-5 days of menstrual cycle, Disp: 60 tablet, Rfl: 1   tranexamic acid (LYSTEDA) 650 mg tablet, , Disp: , Rfl:    ibuprofen (MOTRIN) 800 MG tablet, Take 1 tablet (800 mg total) by mouth every 8 (eight) hours as needed for Pain (Patient not taking: Reported on 07/03/2022), Disp: 30 tablet, Rfl: 1   methylPREDNISolone (MEDROL) 4 MG tablet, Take as directed per pack (Patient not taking: Reported on 05/25/2022), Disp: , Rfl:    norethindrone (AYGESTIN) 5 mg tablet, Take 1 tablet (5 mg total) by mouth once daily (Patient not taking: Reported on 07/03/2022), Disp: 30 tablet, Rfl: 1   tranexamic acid (LYSTEDA) 650 mg tablet, Take 2 tablets (1,300 mg total) by mouth 3 (three) times daily Take for a maximum of  5 days during monthly menstruation. (Patient not taking: Reported on 07/03/2022), Disp: 30 tablet, Rfl: 3   Review of Systems: General:                      No fatigue or weight loss Eyes:                           No vision changes Ears:                            No hearing difficulty Respiratory:                No cough or shortness of breath Pulmonary:                  No asthma or shortness of breath Cardiovascular:           No chest pain, palpitations, dyspnea on exertion Gastrointestinal:          No abdominal bloating, chronic diarrhea, constipations, masses, pain or hematochezia Genitourinary:             No hematuria, dysuria, abnormal vaginal discharge, pelvic pain, Menometrorrhagia, + menorrhagia Lymphatic:                   No swollen lymph nodes Musculoskeletal:         No muscle weakness Neurologic:                   No extremity weakness, syncope, seizure disorder Psychiatric:                  No history of depression, delusions or suicidal/homicidal ideation      Exam:       Vitals:    07/25/22  BP: (!) 140/82  Pulse: 106      Body mass index is 39.81 kg/m.   WDWN white/  female in NAD   Lungs: CTA  CV : RRR without murmur   Neck:  no thyromegaly Abdomen: soft , no mass, normal active bowel sounds,  non-tender, no rebound tenderness Pelvic: tanner stage 5 ,  External genitalia: vulva /labia no lesions Urethra: no prolapse Vagina: normal physiologic d/c Cervix:  high cervix no lesions, no cervical motion tenderness   Uterus: normal size shape and contour, non-tender Adnexa: no mass,  non-tender   Rectovaginal: no mass heme negative   Impression:    The primary encounter diagnosis was Menorrhagia with regular cycle. Diagnoses of Intramural leiomyoma of uterus and Dysmenorrhea were also pertinent to this visit.       Plan:    Option given again : Expectant  management , IUD , Ablation , [place] with infraumbilical extension of incision and morcellation, TAH     After pros and cons dicussed she has elected for TAH and bilateral salpingectomy    Benefits and risks to surgery: The proposed benefit of the surgery has been discussed with the patient. The possible risks include, but are not limited to: organ injury to the bowel , bladder, ureters, and major blood vessels and nerves. There is a possibility of additional surgeries resulting from these injuries. There is also the risk of blood transfusion and the need to receive blood products during or after the procedure which may rarely lead to HIV or Hepatitis C infection. There is a risk of developing a deep venous thrombosis or a pulmonary  embolism . There is the possibility of wound infection and also anesthetic complications, even the rare possibility of death. The patient understands these risks and wishes to proceed. All questions  have been answered No follow-ups on file.   Caroline Sauger, MD

## 2022-08-01 ENCOUNTER — Encounter
Admission: RE | Admit: 2022-08-01 | Discharge: 2022-08-01 | Disposition: A | Discharge status: Home / Self Care | Payer: Medicaid Other | Source: Ambulatory Visit | Attending: Obstetrics and Gynecology | Admitting: Obstetrics and Gynecology

## 2022-08-01 HISTORY — DX: Headache, unspecified: R51.9

## 2022-08-01 HISTORY — DX: Gastro-esophageal reflux disease without esophagitis: K21.9

## 2022-08-01 HISTORY — DX: Elevated blood-pressure reading, without diagnosis of hypertension: R03.0

## 2022-08-01 HISTORY — DX: Anemia, unspecified: D64.9

## 2022-08-01 HISTORY — DX: Family history of other specified conditions: Z84.89

## 2022-08-01 NOTE — Patient Instructions (Signed)
Your procedure is scheduled on:08-09-22 Thursday Report to the Registration Desk on the 1st floor of the [facility].Then proceed to the 2nd floor Surgery Desk To find out your arrival time, please call [phone] between 1PM - 3PM on:08-08-22 Wednesday If your arrival time is 6:00 am, do not arrive prior to that time as the [facility] entrance doors do not open until 6:00 am.  REMEMBER: Instructions that are not followed completely may result in serious medical risk, up to and including death; or upon the discretion of your surgeon and anesthesiologist your surgery may need to be rescheduled.  Do not eat food after midnight the night before surgery.  No gum chewing, lozengers or hard candies.  You may however, drink CLEAR liquids up to 2 hours before you are scheduled to arrive for your surgery. Do not drink anything within 2 hours of your scheduled arrival time.  Clear liquids include: - water  - apple juice without pulp - gatorade (not RED colors) - black coffee or tea (Do NOT add milk or creamers to the coffee or tea) Do NOT drink anything that is not on this list.  In addition, your doctor has ordered for you to drink the provided  Gatorade G2 Drinking this carbohydrate drink up to two hours before surgery helps to reduce insulin resistance and improve patient outcomes. Please complete drinking 2 hours prior to scheduled arrival time.  TAKE THESE MEDICATIONS THE MORNING OF SURGERY WITH A SIP OF WATER: -montelukast (SINGULAIR)  -esomeprazole ([place]) -take one the night before and one on the morning of surgery - helps to prevent nausea after surgery.)  Use your Symbicort and your Albuterol Inhaler the day of surgery and bring your Albuterol Inhaler to the hospital  One week prior to surgery: Stop Anti-inflammatories (NSAIDS) such as Advil, Aleve, Ibuprofen, Motrin, Naproxen, Naprosyn and Aspirin based products such as Excedrin, Goodys Powder, BC Powder.You may however, take  Tylenol if needed for pain up until the day of surgery.  Stop ANY OVER THE COUNTER supplements/vitamins NOW (08-01-22) until after surgery (Multivitamin)  No Alcohol for 24 hours before or after surgery.  No Smoking including e-cigarettes for 24 hours prior to surgery.  No chewable tobacco products for at least 6 hours prior to surgery.  No nicotine patches on the day of surgery.  Do not use any "recreational" drugs for at least a week prior to your surgery.  Please be advised that the combination of cocaine and anesthesia may have negative outcomes, up to and including death. If you test positive for cocaine, your surgery will be cancelled.  On the morning of surgery brush your teeth with toothpaste and water, you may rinse your mouth with mouthwash if you wish. Do not swallow any toothpaste or mouthwash.  Use CHG Soap as directed on instruction sheet.  Do not wear jewelry, make-up, hairpins, clips or nail polish.  Do not wear lotions, powders, or perfumes.   Do not shave body from the neck down 48 hours prior to surgery just in case you cut yourself which could leave a site for infection.  Also, freshly shaved skin may become irritated if using the CHG soap.  Contact lenses, hearing aids and dentures may not be worn into surgery.  Do not bring valuables to the hospital. [facility] is not responsible for any missing/lost belongings or valuables.   Notify your doctor if there is any change in your medical condition (cold, fever, infection).  Wear comfortable clothing (specific to your  surgery type) to the hospital.  After surgery, you can help prevent lung complications by doing breathing exercises.  Take deep breaths and cough every 1-2 hours. Your doctor may order a device called an Incentive Spirometer to help you take deep breaths. When coughing or sneezing, hold a pillow firmly against your incision with both hands. This is called "splinting." Doing this helps protect your  incision. It also decreases belly discomfort.  If you are being admitted to the hospital overnight, leave your suitcase in the car. After surgery it may be brought to your room.  If you are being discharged the day of surgery, you will not be allowed to drive home. You will need a responsible adult (18 years or older) to drive you home and stay with you that night.   If you are taking public transportation, you will need to have a responsible adult (18 years or older) with you. Please confirm with your physician that it is acceptable to use public transportation.   Please call the [facility] at [phone] if you have any questions about these instructions.  Surgery Visitation Policy:  Patients undergoing a surgery or procedure may have two family members or support persons with them as long as the person is not COVID-19 positive or experiencing its symptoms.   Inpatient Visitation:    Visiting hours are 7 a.m. to 8 p.m. Up to four visitors are allowed at one time in a patient room. The visitors may rotate out with other people during the day. One designated support person (adult) may remain overnight.  MASKING: Due to an increase in RSV rates and hospitalizations, starting Wednesday, Nov. 15, in patient care areas in which we serve newborns, infants and children, masks will be required for teammates and visitors.  Children ages 81 and under may not visit. This policy affects the following departments only:  [place] [facility] area Mother Baby Unit Newborn nursery/Special care nursery  Other areas: Masks continue to be strongly recommended for [facility] teammates, visitors and patients in all other areas. Visitation is not restricted outside of the units listed above.   How to Use an Incentive Spirometer An incentive spirometer is a tool that measures how well you are filling your lungs with each breath. Learning to take long,  deep breaths using this tool can help you keep your lungs clear and active. This may help to reverse or lessen your chance of developing breathing (pulmonary) problems, especially infection. You may be asked to use a spirometer: After a surgery. If you have a lung problem or a history of smoking. After a long period of time when you have been unable to move or be active. If the spirometer includes an indicator to show the highest number that you have reached, your health care provider or respiratory therapist will help you set a goal. Keep a log of your progress as told by your health care provider. What are the risks? Breathing too quickly may cause dizziness or cause you to pass out. Take your time so you do not get dizzy or light-headed. If you are in pain, you may need to take pain medicine before doing incentive spirometry. It is harder to take a deep breath if you are having pain. How to use your incentive spirometer  Sit up on the edge of your bed or on a chair. Hold the incentive spirometer so that it is in an upright position. Before you use the spirometer, breathe out  normally. Place the mouthpiece in your mouth. Make sure your lips are closed tightly around it. Breathe in slowly and as deeply as you can through your mouth, causing the piston or the ball to rise toward the top of the chamber. Hold your breath for 3-5 seconds, or for as long as possible. If the spirometer includes a coach indicator, use this to guide you in breathing. Slow down your breathing if the indicator goes above the marked areas. Remove the mouthpiece from your mouth and breathe out normally. The piston or ball will return to the bottom of the chamber. Rest for a few seconds, then repeat the steps 10 or more times. Take your time and take a few normal breaths between deep breaths so that you do not get dizzy or light-headed. Do this every 1-2 hours when you are awake. If the spirometer includes a goal marker to  show the highest number you have reached (best effort), use this as a goal to work toward during each repetition. After each set of 10 deep breaths, cough a few times. This will help to make sure that your lungs are clear. If you have an incision on your chest or abdomen from surgery, place a pillow or a rolled-up towel firmly against the incision when you cough. This can help to reduce pain while taking deep breaths and coughing. General tips When you are able to get out of bed: Walk around often. Continue to take deep breaths and cough in order to clear your lungs. Keep using the incentive spirometer until your health care provider says it is okay to stop using it. If you have been in the hospital, you may be told to keep using the spirometer at home. Contact a health care provider if: You are having difficulty using the spirometer. You have trouble using the spirometer as often as instructed. Your pain medicine is not giving enough relief for you to use the spirometer as told. You have a fever. Get help right away if: You develop shortness of breath. You develop a cough with bloody mucus from the lungs. You have fluid or blood coming from an incision site after you cough. Summary An incentive spirometer is a tool that can help you learn to take long, deep breaths to keep your lungs clear and active. You may be asked to use a spirometer after a surgery, if you have a lung problem or a history of smoking, or if you have been inactive for a long period of time. Use your incentive spirometer as instructed every 1-2 hours while you are awake. If you have an incision on your chest or abdomen, place a pillow or a rolled-up towel firmly against your incision when you cough. This will help to reduce pain. Get help right away if you have shortness of breath, you cough up bloody mucus, or blood comes from your incision when you cough. This information is not intended to replace advice given to you by  your health care provider. Make sure you discuss any questions you have with your health care provider. Document Revised: 11/09/2019 Document Reviewed: 11/09/2019 Elsevier Patient Education  [place].

## 2022-08-03 ENCOUNTER — Encounter
Admission: RE | Admit: 2022-08-03 | Discharge: 2022-08-03 | Disposition: A | Discharge status: Home / Self Care | Payer: Medicaid Other | Source: Ambulatory Visit | Attending: Obstetrics and Gynecology | Admitting: Obstetrics and Gynecology

## 2022-08-03 DIAGNOSIS — Z01812 Encounter for preprocedural laboratory examination: Secondary | ICD-10-CM | POA: Diagnosis present

## 2022-08-03 DIAGNOSIS — Z01818 Encounter for other preprocedural examination: Secondary | ICD-10-CM

## 2022-08-03 LAB — CBC
HCT: 37.2 % (ref 36.0–46.0)
Hemoglobin: 12.3 g/dL (ref 12.0–15.0)
MCH: 29.6 pg (ref 26.0–34.0)
MCHC: 33.1 g/dL (ref 30.0–36.0)
MCV: 89.6 fL (ref 80.0–100.0)
Platelets: 376 10*3/uL (ref 150–400)
RBC: 4.15 MIL/uL (ref 3.87–5.11)
RDW: 17.1 % — ABNORMAL HIGH (ref 11.5–15.5)
WBC: 11.2 10*3/uL — ABNORMAL HIGH (ref 4.0–10.5)
nRBC: 0 % (ref 0.0–0.2)

## 2022-08-03 LAB — BASIC METABOLIC PANEL
Anion gap: 8 (ref 5–15)
BUN: 14 mg/dL (ref 6–20)
CO2: 26 mmol/L (ref 22–32)
Calcium: 8.5 mg/dL — ABNORMAL LOW (ref 8.9–10.3)
Chloride: 107 mmol/L (ref 98–111)
Creatinine, Ser: 0.75 mg/dL (ref 0.44–1.00)
GFR, Estimated: >60 mL/min (ref >60)
Glucose, Bld: 93 mg/dL (ref 70–99)
Potassium: 4 mmol/L (ref 3.5–5.1)
Sodium: 141 mmol/L (ref 135–145)

## 2022-08-08 MED ORDER — POVIDONE-IODINE 10 % EX SWAB
2.0000 | Freq: Once | CUTANEOUS | Status: AC
  Administered 2022-08-09: 2 via TOPICAL

## 2022-08-08 MED ORDER — ACETAMINOPHEN 500 MG PO TABS: 1000.0000 mg | ORAL_TABLET | ORAL | Status: AC

## 2022-08-08 MED ORDER — LACTATED RINGERS IV SOLN: INTRAVENOUS | Status: DC

## 2022-08-08 MED ORDER — CHLORHEXIDINE GLUCONATE 0.12 % MT SOLN: 15.0000 mL | Freq: Once | OROMUCOSAL | Status: AC

## 2022-08-08 MED ORDER — ORAL CARE MOUTH RINSE: 15.0000 mL | Freq: Once | OROMUCOSAL | Status: AC

## 2022-08-08 MED ORDER — CEFAZOLIN SODIUM-DEXTROSE 2-4 GM/100ML-% IV SOLN
2.0000 g | Freq: Once | INTRAVENOUS | Status: AC
  Administered 2022-08-09: 2 g via INTRAVENOUS

## 2022-08-08 MED ORDER — GABAPENTIN 300 MG PO CAPS: 300.0000 mg | ORAL_CAPSULE | ORAL | Status: AC

## 2022-08-09 ENCOUNTER — Inpatient Hospital Stay: Payer: Medicaid Other | Admitting: Anesthesiology

## 2022-08-09 ENCOUNTER — Encounter
Admission: RE | Disposition: A | Discharge status: Home / Self Care | Payer: Self-pay | Source: Home / Self Care | Attending: Obstetrics and Gynecology

## 2022-08-09 ENCOUNTER — Inpatient Hospital Stay
Admission: RE | Admit: 2022-08-09 | Discharge: 2022-08-10 | DRG: 742 | Disposition: A | Discharge status: Home / Self Care | Payer: Medicaid Other | Attending: Obstetrics and Gynecology | Admitting: Obstetrics and Gynecology

## 2022-08-09 ENCOUNTER — Other Ambulatory Visit: Payer: Self-pay

## 2022-08-09 ENCOUNTER — Inpatient Hospital Stay: Payer: Medicaid Other | Admitting: Urgent Care

## 2022-08-09 ENCOUNTER — Encounter: Payer: Self-pay | Admitting: Obstetrics and Gynecology

## 2022-08-09 DIAGNOSIS — D251 Intramural leiomyoma of uterus: Secondary | ICD-10-CM | POA: Diagnosis present

## 2022-08-09 DIAGNOSIS — Q430 Meckel's diverticulum (displaced) (hypertrophic): Secondary | ICD-10-CM

## 2022-08-09 DIAGNOSIS — Z6841 Body Mass Index (BMI) 40.0 and over, adult: Secondary | ICD-10-CM

## 2022-08-09 DIAGNOSIS — N920 Excessive and frequent menstruation with regular cycle: Principal | ICD-10-CM | POA: Diagnosis present

## 2022-08-09 DIAGNOSIS — N946 Dysmenorrhea, unspecified: Secondary | ICD-10-CM | POA: Diagnosis present

## 2022-08-09 DIAGNOSIS — F17210 Nicotine dependence, cigarettes, uncomplicated: Secondary | ICD-10-CM | POA: Diagnosis present

## 2022-08-09 DIAGNOSIS — Z01818 Encounter for other preprocedural examination: Principal | ICD-10-CM

## 2022-08-09 HISTORY — PX: HYSTERECTOMY ABDOMINAL WITH SALPINGECTOMY: SHX6725

## 2022-08-09 LAB — TYPE AND SCREEN
ABO/RH(D): O POS
Antibody Screen: NEGATIVE

## 2022-08-09 LAB — CBC
HCT: 40.3 % (ref 36.0–46.0)
Hemoglobin: 13.1 g/dL (ref 12.0–15.0)
MCH: 29.3 pg (ref 26.0–34.0)
MCHC: 32.5 g/dL (ref 30.0–36.0)
MCV: 90.2 fL (ref 80.0–100.0)
Platelets: 333 10*3/uL (ref 150–400)
RBC: 4.47 MIL/uL (ref 3.87–5.11)
RDW: 16.7 % — ABNORMAL HIGH (ref 11.5–15.5)
WBC: 19.3 10*3/uL — ABNORMAL HIGH (ref 4.0–10.5)
nRBC: 0 % (ref 0.0–0.2)

## 2022-08-09 LAB — BASIC METABOLIC PANEL
Anion gap: 7 (ref 5–15)
BUN: 12 mg/dL (ref 6–20)
CO2: 22 mmol/L (ref 22–32)
Calcium: 8.6 mg/dL — ABNORMAL LOW (ref 8.9–10.3)
Chloride: 103 mmol/L (ref 98–111)
Creatinine, Ser: 0.66 mg/dL (ref 0.44–1.00)
GFR, Estimated: >60 mL/min (ref >60)
Glucose, Bld: 134 mg/dL — ABNORMAL HIGH (ref 70–99)
Potassium: 3.7 mmol/L (ref 3.5–5.1)
Sodium: 132 mmol/L — ABNORMAL LOW (ref 135–145)

## 2022-08-09 LAB — ABO/RH: ABO/RH(D): O POS

## 2022-08-09 LAB — POCT PREGNANCY, URINE: Preg Test, Ur: NEGATIVE

## 2022-08-09 SURGERY — HYSTERECTOMY, TOTAL, ABDOMINAL, WITH SALPINGECTOMY
Anesthesia: General | Laterality: Bilateral

## 2022-08-09 MED ORDER — SUGAMMADEX SODIUM 500 MG/5ML IV SOLN
INTRAVENOUS | Status: DC | PRN
  Administered 2022-08-09: 200 mg via INTRAVENOUS

## 2022-08-09 MED ORDER — MIDAZOLAM HCL 2 MG/2ML IJ SOLN
INTRAMUSCULAR | Status: DC | PRN
  Administered 2022-08-09: 2 mg via INTRAVENOUS

## 2022-08-09 MED ORDER — LOSARTAN POTASSIUM 25 MG PO TABS
12.5000 mg | ORAL_TABLET | Freq: Every day | ORAL | Status: DC
  Administered 2022-08-10: 12.5 mg via ORAL
  Filled 2022-08-09: qty 0.5

## 2022-08-09 MED ORDER — ALBUTEROL SULFATE HFA 108 (90 BASE) MCG/ACT IN AERS
INHALATION_SPRAY | RESPIRATORY_TRACT | Status: DC | PRN
  Administered 2022-08-09 (×2): 3 via RESPIRATORY_TRACT

## 2022-08-09 MED ORDER — ONDANSETRON HCL 4 MG/2ML IJ SOLN
INTRAMUSCULAR | Status: AC
  Filled 2022-08-09: qty 2

## 2022-08-09 MED ORDER — MIDAZOLAM HCL 2 MG/2ML IJ SOLN
INTRAMUSCULAR | Status: AC
  Filled 2022-08-09: qty 2

## 2022-08-09 MED ORDER — HYDROMORPHONE HCL 1 MG/ML IJ SOLN
INTRAMUSCULAR | Status: DC | PRN
  Administered 2022-08-09 (×4): .5 mg via INTRAVENOUS

## 2022-08-09 MED ORDER — FENTANYL CITRATE (PF) 100 MCG/2ML IJ SOLN
25.0000 ug | INTRAMUSCULAR | Status: DC | PRN
  Administered 2022-08-09: 50 ug via INTRAVENOUS
  Administered 2022-08-09 (×2): 25 ug via INTRAVENOUS

## 2022-08-09 MED ORDER — ONDANSETRON HCL 4 MG/2ML IJ SOLN: 4.0000 mg | Freq: Four times a day (QID) | INTRAMUSCULAR | Status: DC | PRN

## 2022-08-09 MED ORDER — KETOROLAC TROMETHAMINE 30 MG/ML IJ SOLN
30.0000 mg | Freq: Once | INTRAMUSCULAR | Status: AC
  Administered 2022-08-09: 30 mg via INTRAVENOUS

## 2022-08-09 MED ORDER — CHLORHEXIDINE GLUCONATE 0.12 % MT SOLN
OROMUCOSAL | Status: AC
  Administered 2022-08-09: 15 mL via OROMUCOSAL
  Filled 2022-08-09: qty 15

## 2022-08-09 MED ORDER — LACTATED RINGERS IV SOLN: INTRAVENOUS | Status: DC

## 2022-08-09 MED ORDER — ACETAMINOPHEN 500 MG PO TABS
1000.0000 mg | ORAL_TABLET | Freq: Four times a day (QID) | ORAL | Status: DC
  Administered 2022-08-09 – 2022-08-10 (×5): 1000 mg via ORAL
  Filled 2022-08-09 (×5): qty 2

## 2022-08-09 MED ORDER — SODIUM CHLORIDE (PF) 0.9 % IJ SOLN
INTRAMUSCULAR | Status: DC | PRN
  Administered 2022-08-09: 80 mL

## 2022-08-09 MED ORDER — METRONIDAZOLE 500 MG/100ML IV SOLN
500.0000 mg | Freq: Two times a day (BID) | INTRAVENOUS | Status: DC
  Filled 2022-08-09: qty 100

## 2022-08-09 MED ORDER — SODIUM CHLORIDE FLUSH 0.9 % IV SOLN
INTRAVENOUS | Status: AC
  Filled 2022-08-09: qty 20

## 2022-08-09 MED ORDER — ROCURONIUM BROMIDE 10 MG/ML (PF) SYRINGE
PREFILLED_SYRINGE | INTRAVENOUS | Status: AC
  Filled 2022-08-09: qty 10

## 2022-08-09 MED ORDER — 0.9 % SODIUM CHLORIDE (POUR BTL) OPTIME
TOPICAL | Status: DC | PRN
  Administered 2022-08-09: 500 mL

## 2022-08-09 MED ORDER — FENTANYL CITRATE (PF) 100 MCG/2ML IJ SOLN
INTRAMUSCULAR | Status: AC
  Filled 2022-08-09: qty 2

## 2022-08-09 MED ORDER — ONDANSETRON HCL 4 MG PO TABS: 4.0000 mg | ORAL_TABLET | Freq: Four times a day (QID) | ORAL | Status: DC | PRN

## 2022-08-09 MED ORDER — IBUPROFEN 600 MG PO TABS: 600.0000 mg | ORAL_TABLET | Freq: Four times a day (QID) | ORAL | Status: DC

## 2022-08-09 MED ORDER — HYDROMORPHONE HCL 1 MG/ML IJ SOLN
INTRAMUSCULAR | Status: AC
  Filled 2022-08-09: qty 1

## 2022-08-09 MED ORDER — STERILE WATER FOR IRRIGATION IR SOLN
Status: DC | PRN
  Administered 2022-08-09: 1000 mL
  Administered 2022-08-09: 500 mL

## 2022-08-09 MED ORDER — DEXAMETHASONE SODIUM PHOSPHATE 10 MG/ML IJ SOLN
INTRAMUSCULAR | Status: DC | PRN
  Administered 2022-08-09: 10 mg via INTRAVENOUS

## 2022-08-09 MED ORDER — ONDANSETRON HCL 4 MG/2ML IJ SOLN: 4.0000 mg | Freq: Once | INTRAMUSCULAR | Status: DC | PRN

## 2022-08-09 MED ORDER — LIDOCAINE HCL (PF) 2 % IJ SOLN
INTRAMUSCULAR | Status: AC
  Filled 2022-08-09: qty 5

## 2022-08-09 MED ORDER — DEXMEDETOMIDINE HCL IN NACL 80 MCG/20ML IV SOLN
INTRAVENOUS | Status: DC | PRN
  Administered 2022-08-09: 4 ug via BUCCAL
  Administered 2022-08-09 (×2): 8 ug via BUCCAL

## 2022-08-09 MED ORDER — NALOXONE HCL 0.4 MG/ML IJ SOLN: 0.4000 mg | INTRAMUSCULAR | Status: DC | PRN

## 2022-08-09 MED ORDER — DIPHENHYDRAMINE HCL 50 MG/ML IJ SOLN: 12.5000 mg | Freq: Four times a day (QID) | INTRAMUSCULAR | Status: DC | PRN

## 2022-08-09 MED ORDER — SIMETHICONE 80 MG PO CHEW
80.0000 mg | CHEWABLE_TABLET | Freq: Four times a day (QID) | ORAL | Status: DC | PRN
  Administered 2022-08-09 – 2022-08-10 (×2): 80 mg via ORAL
  Filled 2022-08-09 (×2): qty 1

## 2022-08-09 MED ORDER — ACETAMINOPHEN 10 MG/ML IV SOLN: 1000.0000 mg | Freq: Once | INTRAVENOUS | Status: DC | PRN

## 2022-08-09 MED ORDER — PHENYLEPHRINE HCL (PRESSORS) 10 MG/ML IV SOLN
INTRAVENOUS | Status: DC | PRN
  Administered 2022-08-09: 80 ug via INTRAVENOUS

## 2022-08-09 MED ORDER — METRONIDAZOLE 500 MG/100ML IV SOLN
500.0000 mg | Freq: Once | INTRAVENOUS | Status: AC
  Administered 2022-08-09: 500 mg via INTRAVENOUS
  Filled 2022-08-09: qty 100

## 2022-08-09 MED ORDER — KETAMINE HCL 10 MG/ML IJ SOLN
INTRAMUSCULAR | Status: DC | PRN
  Administered 2022-08-09: 30 mg via INTRAVENOUS
  Administered 2022-08-09: 20 mg via INTRAVENOUS

## 2022-08-09 MED ORDER — PANTOPRAZOLE SODIUM 40 MG PO TBEC
40.0000 mg | DELAYED_RELEASE_TABLET | Freq: Every day | ORAL | Status: DC
  Administered 2022-08-10: 40 mg via ORAL
  Filled 2022-08-09: qty 1

## 2022-08-09 MED ORDER — KETAMINE HCL 50 MG/5ML IJ SOSY
PREFILLED_SYRINGE | INTRAMUSCULAR | Status: AC
  Filled 2022-08-09: qty 5

## 2022-08-09 MED ORDER — PHENYLEPHRINE 80 MCG/ML (10ML) SYRINGE FOR IV PUSH (FOR BLOOD PRESSURE SUPPORT)
PREFILLED_SYRINGE | INTRAVENOUS | Status: AC
  Filled 2022-08-09: qty 10

## 2022-08-09 MED ORDER — BUPIVACAINE LIPOSOME 1.3 % IJ SUSP
INTRAMUSCULAR | Status: AC
  Filled 2022-08-09: qty 20

## 2022-08-09 MED ORDER — SODIUM CHLORIDE 0.9% FLUSH: 9.0000 mL | INTRAVENOUS | Status: DC | PRN

## 2022-08-09 MED ORDER — BUPIVACAINE HCL (PF) 0.5 % IJ SOLN
INTRAMUSCULAR | Status: AC
  Filled 2022-08-09: qty 30

## 2022-08-09 MED ORDER — GABAPENTIN 300 MG PO CAPS
ORAL_CAPSULE | ORAL | Status: AC
  Administered 2022-08-09: 300 mg via ORAL
  Filled 2022-08-09: qty 1

## 2022-08-09 MED ORDER — ALBUTEROL SULFATE HFA 108 (90 BASE) MCG/ACT IN AERS
INHALATION_SPRAY | RESPIRATORY_TRACT | Status: AC
  Filled 2022-08-09: qty 6.7

## 2022-08-09 MED ORDER — CEFAZOLIN SODIUM-DEXTROSE 2-4 GM/100ML-% IV SOLN
INTRAVENOUS | Status: AC
  Filled 2022-08-09: qty 100

## 2022-08-09 MED ORDER — KETOROLAC TROMETHAMINE 30 MG/ML IJ SOLN
30.0000 mg | Freq: Four times a day (QID) | INTRAMUSCULAR | Status: AC
  Administered 2022-08-09 – 2022-08-10 (×4): 30 mg via INTRAVENOUS
  Filled 2022-08-09 (×4): qty 1

## 2022-08-09 MED ORDER — DIPHENHYDRAMINE HCL 12.5 MG/5ML PO ELIX: 12.5000 mg | ORAL_SOLUTION | Freq: Four times a day (QID) | ORAL | Status: DC | PRN

## 2022-08-09 MED ORDER — MORPHINE SULFATE 1 MG/ML IV SOLN PCA
INTRAVENOUS | Status: DC
  Administered 2022-08-09: 8.96 mg via INTRAVENOUS
  Administered 2022-08-09: 15.65 mg via INTRAVENOUS
  Administered 2022-08-09: 4.76 mg via INTRAVENOUS
  Administered 2022-08-10 (×2): 11 mg via INTRAVENOUS
  Filled 2022-08-09 (×3): qty 30

## 2022-08-09 MED ORDER — FENTANYL CITRATE (PF) 100 MCG/2ML IJ SOLN
INTRAMUSCULAR | Status: AC
  Administered 2022-08-09: 50 ug via INTRAVENOUS
  Filled 2022-08-09: qty 2

## 2022-08-09 MED ORDER — ROCURONIUM BROMIDE 100 MG/10ML IV SOLN
INTRAVENOUS | Status: DC | PRN
  Administered 2022-08-09: 30 mg via INTRAVENOUS
  Administered 2022-08-09: 20 mg via INTRAVENOUS
  Administered 2022-08-09: 50 mg via INTRAVENOUS
  Administered 2022-08-09: 20 mg via INTRAVENOUS

## 2022-08-09 MED ORDER — OXYCODONE HCL 5 MG/5ML PO SOLN: 5.0000 mg | Freq: Once | ORAL | Status: DC | PRN

## 2022-08-09 MED ORDER — PROPOFOL 10 MG/ML IV BOLUS
INTRAVENOUS | Status: DC | PRN
  Administered 2022-08-09: 160 mg via INTRAVENOUS

## 2022-08-09 MED ORDER — LIDOCAINE HCL (CARDIAC) PF 100 MG/5ML IV SOSY
PREFILLED_SYRINGE | INTRAVENOUS | Status: DC | PRN
  Administered 2022-08-09: 100 mg via INTRAVENOUS

## 2022-08-09 MED ORDER — ACETAMINOPHEN 500 MG PO TABS
ORAL_TABLET | ORAL | Status: AC
  Administered 2022-08-09: 1000 mg via ORAL
  Filled 2022-08-09: qty 2

## 2022-08-09 MED ORDER — KETOROLAC TROMETHAMINE 30 MG/ML IJ SOLN
INTRAMUSCULAR | Status: AC
  Filled 2022-08-09: qty 1

## 2022-08-09 MED ORDER — GABAPENTIN 300 MG PO CAPS
300.0000 mg | ORAL_CAPSULE | Freq: Every day | ORAL | Status: DC
  Administered 2022-08-09: 300 mg via ORAL
  Filled 2022-08-09: qty 1

## 2022-08-09 MED ORDER — DEXAMETHASONE SODIUM PHOSPHATE 10 MG/ML IJ SOLN
INTRAMUSCULAR | Status: AC
  Filled 2022-08-09: qty 1

## 2022-08-09 MED ORDER — HYDROCHLOROTHIAZIDE 50 MG PO TABS
50.0000 mg | ORAL_TABLET | Freq: Every day | ORAL | Status: DC
  Administered 2022-08-10: 50 mg via ORAL
  Filled 2022-08-09: qty 1

## 2022-08-09 MED ORDER — FENTANYL CITRATE (PF) 100 MCG/2ML IJ SOLN
INTRAMUSCULAR | Status: DC | PRN
  Administered 2022-08-09 (×2): 50 ug via INTRAVENOUS

## 2022-08-09 MED ORDER — OXYCODONE HCL 5 MG PO TABS: 5.0000 mg | ORAL_TABLET | Freq: Once | ORAL | Status: DC | PRN

## 2022-08-09 MED ORDER — PROPOFOL 10 MG/ML IV BOLUS
INTRAVENOUS | Status: AC
  Filled 2022-08-09: qty 20

## 2022-08-09 SURGICAL SUPPLY — 46 items
CHLORAPREP W/TINT 26 (MISCELLANEOUS) ×1 IMPLANT
CNTNR SPEC 2.5X3XGRAD LEK (MISCELLANEOUS)
CONT SPEC 4OZ STRL OR WHT (MISCELLANEOUS)
CONTAINER SPEC 2.5X3XGRAD LEK (MISCELLANEOUS) ×1 IMPLANT
DRAPE LAP W/FLUID (DRAPES) ×1 IMPLANT
DRAPE UNDER BUTTOCK W/FLU (DRAPES) ×1 IMPLANT
DRSG TELFA 3X8 NADH STRL (GAUZE/BANDAGES/DRESSINGS) ×1 IMPLANT
ELECT BLADE 6.5 EXT (BLADE) IMPLANT
ELECT REM PT RETURN 9FT ADLT (ELECTROSURGICAL) ×1
ELECTRODE REM PT RTRN 9FT ADLT (ELECTROSURGICAL) ×1 IMPLANT
GAUZE 4X4 16PLY ~~LOC~~+RFID DBL (SPONGE) ×2 IMPLANT
GAUZE SPONGE 4X4 12PLY STRL (GAUZE/BANDAGES/DRESSINGS) ×1 IMPLANT
GLOVE BIO SURGEON STRL SZ7 (GLOVE) ×1 IMPLANT
GLOVE BIOGEL PI IND STRL 6.5 (GLOVE) ×1 IMPLANT
GLOVE SURG SYN 8.0 (GLOVE) ×1 IMPLANT
GLOVE SURG SYN 8.0 PF PI (GLOVE) ×1 IMPLANT
GOWN STRL REUS W/ TWL LRG LVL3 (GOWN DISPOSABLE) ×2 IMPLANT
GOWN STRL REUS W/ TWL XL LVL3 (GOWN DISPOSABLE) ×1 IMPLANT
GOWN STRL REUS W/TWL LRG LVL3 (GOWN DISPOSABLE) ×2
GOWN STRL REUS W/TWL XL LVL3 (GOWN DISPOSABLE) ×1
KIT TURNOVER CYSTO (KITS) ×1 IMPLANT
LABEL OR SOLS (LABEL) ×1 IMPLANT
MANIFOLD NEPTUNE II (INSTRUMENTS) ×1 IMPLANT
NEEDLE HYPO 22GX1.5 SAFETY (NEEDLE) ×2 IMPLANT
PACK BASIN MAJOR ARMC (MISCELLANEOUS) ×1 IMPLANT
RETAINER VISCERA MED (MISCELLANEOUS) IMPLANT
SCRUB CHG 4% DYNA-HEX 4OZ (MISCELLANEOUS) ×1 IMPLANT
SET CYSTO W/LG BORE CLAMP LF (SET/KITS/TRAYS/PACK) IMPLANT
SPONGE T-LAP 18X18 ~~LOC~~+RFID (SPONGE) ×2 IMPLANT
STAPLER INSORB 30 2030 C-SECTI (MISCELLANEOUS) IMPLANT
STAPLER SKIN PROX 35W (STAPLE) IMPLANT
SURGILUBE 2OZ TUBE FLIPTOP (MISCELLANEOUS) ×1 IMPLANT
SUT CHROMIC 2 0 CT 1 (SUTURE) IMPLANT
SUT PDS AB 1 TP1 96 (SUTURE) IMPLANT
SUT VIC AB 0 CT1 27 (SUTURE) ×3
SUT VIC AB 0 CT1 27XCR 8 STRN (SUTURE) ×2 IMPLANT
SUT VIC AB 0 CT1 36 (SUTURE) ×2 IMPLANT
SUT VIC AB 2-0 SH 27 (SUTURE) ×6
SUT VIC AB 2-0 SH 27XBRD (SUTURE) IMPLANT
SYR 20ML LL LF (SYRINGE) ×2 IMPLANT
SYR 30ML LL (SYRINGE) ×1 IMPLANT
SYR BULB IRRIG 60ML STRL (SYRINGE) ×1 IMPLANT
TRAP FLUID SMOKE EVACUATOR (MISCELLANEOUS) ×1 IMPLANT
TRAY FOLEY MTR SLVR 16FR STAT (SET/KITS/TRAYS/PACK) ×1 IMPLANT
WATER STERILE IRR 1000ML POUR (IV SOLUTION) ×1 IMPLANT
WATER STERILE IRR 500ML POUR (IV SOLUTION) ×1 IMPLANT

## 2022-08-09 NOTE — Anesthesia Postprocedure Evaluation (Signed)
Anesthesia Post Note  Patient: Kelli Morrison  Procedure(s) Performed: HYSTERECTOMY ABDOMINAL WITH SALPINGECTOMY (Bilateral)  Patient location during evaluation: PACU Anesthesia Type: General Level of consciousness: awake and alert, oriented and patient cooperative Pain management: pain level controlled Vital Signs Assessment: post-procedure vital signs reviewed and stable Respiratory status: spontaneous breathing, nonlabored ventilation and respiratory function stable Cardiovascular status: blood pressure returned to baseline and stable Postop Assessment: adequate PO intake Anesthetic complications: no   No notable events documented.   Last Vitals:  Vitals:   08/09/22 1202 08/09/22 1236  BP: (!) 149/91   Pulse: 87   Resp: 16 13  Temp: (!) 36.4 C   SpO2: 95% 95%    Last Pain:  Vitals:   08/09/22 1236  TempSrc:   PainSc: [place]

## 2022-08-09 NOTE — Progress Notes (Signed)
Day of Surgery Procedure(s) (LRB): HYSTERECTOMY ABDOMINAL WITH SALPINGECTOMY (Bilateral)  Subjective: Patient reports pain on PCA only 5 mg MS in 2.5 hrs     Objective: I have reviewed patient's vital signs.  General: alert and cooperative Urine out put good  Assessment: s/p Procedure(s): HYSTERECTOMY ABDOMINAL WITH SALPINGECTOMY (Bilateral): stable  Plan: Cont IS . Cont PCA Add losartan 12.5 / 50 mg HCTZ start in am  Protonix 40 mg qd in am    Boykin Nearing, MD 08/09/2022, 3:31 PM

## 2022-08-09 NOTE — Anesthesia Preprocedure Evaluation (Addendum)
Anesthesia Evaluation  Patient identified by MRN, date of birth, ID band Patient awake    Reviewed: Allergy & Precautions, NPO status , Patient's Chart, lab work & pertinent test results  History of Anesthesia Complications Negative for: history of anesthetic complications  Airway Mallampati: I   Neck ROM: Full    Dental  (+) Missing   Pulmonary asthma , Current Smoker (1-1.5 ppd) and Patient abstained from smoking.   Pulmonary exam normal breath sounds clear to auscultation       Cardiovascular hypertension, Normal cardiovascular exam Rhythm:Regular Rate:Normal     Neuro/Psych  Headaches    GI/Hepatic ,GERD  ,,  Endo/Other  Class 3 obesity  Renal/GU negative Renal ROS     Musculoskeletal   Abdominal   Peds  Hematology  (+) Blood dyscrasia, anemia   Anesthesia Other Findings   Reproductive/Obstetrics                             Anesthesia Physical Anesthesia Plan  ASA: 3  Anesthesia Plan: General   Post-op Pain Management:    Induction: Intravenous  PONV Risk Score and Plan: 2 and Ondansetron, Dexamethasone and Treatment may vary due to age or medical condition  Airway Management Planned: Oral ETT  Additional Equipment:   Intra-op Plan:   Post-operative Plan: Extubation in OR  Informed Consent: I have reviewed the patients History and Physical, chart, labs and discussed the procedure including the risks, benefits and alternatives for the proposed anesthesia with the patient or authorized representative who has indicated his/her understanding and acceptance.     Dental advisory given  Plan Discussed with: CRNA  Anesthesia Plan Comments: (Patient consented for risks of anesthesia including but not limited to:  - adverse reactions to medications - damage to eyes, teeth, lips or other oral mucosa - nerve damage due to positioning  - sore throat or hoarseness - damage to  heart, brain, nerves, lungs, other parts of body or loss of life  Informed patient about role of CRNA in peri- and intra-operative care.  Patient voiced understanding.)        Anesthesia Quick Evaluation

## 2022-08-09 NOTE — Progress Notes (Signed)
Pt ready for TAH , Bilat salpingectomy  Labs reviewed . Water only today . All questions answered Proceed

## 2022-08-09 NOTE — Brief Op Note (Signed)
08/09/2022  10:26 AM  PATIENT:  Kelli Morrison  36 y.o. female  PRE-OPERATIVE DIAGNOSIS:  menorrhagia, fibroid  POST-OPERATIVE DIAGNOSIS:  menorrhagia, fibroid Meckel,s diverticulum inadvertent finding  PROCEDURE:  Procedure(s): HYSTERECTOMY ABDOMINAL WITH SALPINGECTOMY (Bilateral)  SURGEON:  Surgeon(s) and Role:    * Aramis Weil, Gwen Her, MD - Primary    * Benjaman Kindler, MD - Assisting PA student   Lucia Gaskins  PHYSICIAN ASSISTANT:   ASSISTANTS: none   ANESTHESIA:   general  EBL:  70 mL IOF 1000 UO 300cc   BLOOD ADMINISTERED:none  DRAINS: Urinary Catheter (Foley)   LOCAL MEDICATIONS USED:  MARCAINE     SPECIMEN:  Source of Specimen:  cervix , uterus and bilateral fallopian tubes   DISPOSITION OF SPECIMEN:  PATHOLOGY  COUNTS:  YES  TOURNIQUET:  * No tourniquets in log *  DICTATION: .Other Dictation: Dictation Number verbal  PLAN OF CARE: Admit to inpatient   PATIENT DISPOSITION:  PACU - hemodynamically stable.   Delay start of Pharmacological VTE agent (>24hrs) due to surgical blood loss or risk of bleeding: not applicable

## 2022-08-09 NOTE — Progress Notes (Signed)
New morphine syringe requested from pharmacy at 2113 for PCA pump.

## 2022-08-09 NOTE — Progress Notes (Signed)
Pt able to ambulate around bed in room. Still needs supplemental oxygen to keep O2 saturations up above 92%. Does well ambulating and moving around. Pt also using incentive spirometer frequently. Able to reach 1500-2000 each time.   Pt now on 1L supplemental oxygen. Nurse will monitor how well pt tolerates this change.   PCA pump syringe changed out at 2200. Pt still rating pain in abdomen 6/10 but states she feels comfortable enough to sleep and pushes her pain button when needed.   No nausea. Pt able to tolerate clear liquid diet.   Output is great, well past adequate of 63m/hr. Will remove foley catheter at midnight.

## 2022-08-09 NOTE — Anesthesia Procedure Notes (Signed)
Procedure Name: Intubation Date/Time: 08/09/2022 7:41 AM  Performed by: Jerrye Noble, CRNAPre-anesthesia Checklist: Patient identified, Emergency Drugs available, Suction available and Patient being monitored Patient Re-evaluated:Patient Re-evaluated prior to induction Oxygen Delivery Method: Circle system utilized Preoxygenation: Pre-oxygenation with 100% oxygen Induction Type: IV induction Ventilation: Mask ventilation without difficulty Laryngoscope Size: McGraph and 3 Grade View: Grade I Tube type: Oral Tube size: 7.0 mm Number of attempts: 1 Airway Equipment and Method: Stylet and Video-laryngoscopy Placement Confirmation: ETT inserted through vocal cords under direct vision, positive ETCO2 and breath sounds checked- equal and bilateral Secured at: 22 cm Tube secured with: Tape Dental Injury: Teeth and Oropharynx as per pre-operative assessment

## 2022-08-09 NOTE — Transfer of Care (Signed)
Immediate Anesthesia Transfer of Care Note  Patient: Kelli Morrison  Procedure(s) Performed: HYSTERECTOMY ABDOMINAL WITH SALPINGECTOMY (Bilateral)  Patient Location: PACU  Anesthesia Type:General  Level of Consciousness: awake, drowsy, and patient cooperative  Airway & Oxygen Therapy: Patient Spontanous Breathing and Patient connected to face mask oxygen  Post-op Assessment: Report given to RN and Post -op Vital signs reviewed and stable  Post vital signs: Reviewed and stable  Last Vitals:  Vitals Value Taken Time  BP 119/72 08/09/22 1046  Temp    Pulse 92 08/09/22 1050  Resp 17 08/09/22 1053  SpO2 93 % 08/09/22 1050  Vitals shown include unvalidated device data.  Last Pain:  Vitals:   08/09/22 0643  TempSrc: [place]  PainSc: 2       Patients Stated Pain Goal: 0 (29/02/11 1552)  Complications: No notable events documented.

## 2022-08-10 ENCOUNTER — Encounter: Payer: Self-pay | Admitting: Obstetrics and Gynecology

## 2022-08-10 LAB — SURGICAL PATHOLOGY

## 2022-08-10 MED ORDER — ACETAMINOPHEN EXTRA STRENGTH 500 MG PO TABS: 1000.0000 mg | ORAL_TABLET | Freq: Four times a day (QID) | ORAL | 0 refills | Status: AC

## 2022-08-10 MED ORDER — IBUPROFEN 800 MG PO TABS: 800.0000 mg | ORAL_TABLET | Freq: Three times a day (TID) | ORAL | 1 refills | Status: AC

## 2022-08-10 MED ORDER — OXYCODONE HCL 5 MG PO TABS: 5.0000 mg | ORAL_TABLET | ORAL | 0 refills | Status: DC | PRN

## 2022-08-10 MED ORDER — MONTELUKAST SODIUM 10 MG PO TABS
10.0000 mg | ORAL_TABLET | Freq: Every morning | ORAL | Status: DC
  Administered 2022-08-10: 10 mg via ORAL
  Filled 2022-08-10: qty 1

## 2022-08-10 MED ORDER — GABAPENTIN 600 MG PO TABS: 600.0000 mg | ORAL_TABLET | Freq: Every day | ORAL | 3 refills | Status: DC

## 2022-08-10 MED ORDER — DOCUSATE SODIUM 100 MG PO CAPS: 100.0000 mg | ORAL_CAPSULE | Freq: Two times a day (BID) | ORAL | 0 refills | Status: DC

## 2022-08-10 MED ORDER — OXYCODONE HCL 5 MG PO TABS
5.0000 mg | ORAL_TABLET | ORAL | Status: DC | PRN
  Administered 2022-08-10 (×2): 5 mg via ORAL
  Filled 2022-08-10 (×2): qty 1

## 2022-08-10 NOTE — Progress Notes (Signed)
[facility].  Patient's father waiting at entrance for transportation home.  Discharge instructions reviewed and questions answered.  Paper copy of instructions given to patient for reference after discharge.

## 2022-08-10 NOTE — Discharge Summary (Signed)
1 Day Post-Op       Procedure(s): HYSTERECTOMY ABDOMINAL WITH SALPINGECTOMY (Bilateral) Subjective: The patient is doing well.  No nausea or vomiting. Pain is adequately controlled with po meds. Tolerating regular po intake, ambulating without assist. No fever.  Objective: Vital signs in last 24 hours: Temp:  [97.5 F (36.4 C)-98.4 F (36.9 C)] 97.9 F (36.6 C) (12/08 1209) Pulse Rate:  [64-103] 89 (12/08 1209) Resp:  [8-20] 20 (12/08 1209) BP: (106-154)/(67-97) 127/85 (12/08 1209) SpO2:  [90 %-99 %] 96 % (12/08 1035) FiO2 (%):  [97 %] 97 % (12/08 0839)  Intake/Output  Intake/Output Summary (Last 24 hours) at 08/10/2022 1241 Last data filed at 08/10/2022 0945 Gross per 24 hour  Intake 2051.86 ml  Output 3659 ml  Net -1607.14 ml    Physical Exam:  General: Alert and oriented. CV: RRR Lungs: Clear bilaterally. GI: Soft, Nondistended. Incisions: Clean and dry. Extremities: Nontender, no erythema, no edema.  Lab Results: Recent Labs    08/09/22 1252  HGB 13.1  HCT 40.3  WBC 19.3*  PLT 333                 Results for orders placed or performed during the hospital encounter of 08/09/22 (from the past 24 hour(s))  CBC     Status: Abnormal   Collection Time: 08/09/22 12:52 PM  Result Value Ref Range   WBC 19.3 (H) 4.0 - 10.5 K/uL   RBC 4.47 3.87 - 5.11 MIL/uL   Hemoglobin 13.1 12.0 - 15.0 g/dL   HCT 40.3 36.0 - 46.0 %   MCV 90.2 80.0 - 100.0 fL   MCH 29.3 26.0 - 34.0 pg   MCHC 32.5 30.0 - 36.0 g/dL   RDW 16.7 (H) 11.5 - 15.5 %   Platelets 333 150 - 400 K/uL   nRBC 0.0 0.0 - 0.2 %  Basic metabolic panel     Status: Abnormal   Collection Time: 08/09/22 12:52 PM  Result Value Ref Range   Sodium 132 (L) 135 - 145 mmol/L   Potassium 3.7 3.5 - 5.1 mmol/L   Chloride 103 98 - 111 mmol/L   CO2 22 22 - 32 mmol/L   Glucose, Bld 134 (H) 70 - 99 mg/dL   BUN 12 6 - 20 mg/dL   Creatinine, Ser 0.66 0.44 - 1.00 mg/dL   Calcium 8.6 (L) 8.9 - 10.3 mg/dL   GFR, Estimated >60  >60 mL/min   Anion gap 7 5 - 15    Assessment/Plan: 1 Day Post-Op       Procedure(s): HYSTERECTOMY ABDOMINAL WITH SALPINGECTOMY (Bilateral)  -Ambulate, Incentive spirometry -Advance diet as tolerated -Transition to oral pain medication -Discharge home today anticipated  - f/u with general surgery for review of the incidental finding of meckel's diverticulum  Benjaman Kindler, MD   LOS: 1 day   Benjaman Kindler 08/10/2022, 12:41 PM

## 2022-08-10 NOTE — Progress Notes (Signed)
Pt back up to 2L of oxygen per her request while sleeping. She suspects she has sleep apnea.

## 2022-08-13 NOTE — Op Note (Signed)
NAME: Kelli Morrison, TEFFETELLER MEDICAL RECORD NO: [id number] ACCOUNT NO: [id number] DATE OF BIRTH: Mar 22, 1986 FACILITY: [facility] LOCATION: [facility] PHYSICIAN: Boykin Nearing, MD  Operative Report   DATE OF PROCEDURE: 08/09/2022  PREOPERATIVE DIAGNOSES: 1.  Menorrhagia. 2.  Fibroid uterus.  POSTOPERATIVE DIAGNOSES:   1.  Menorrhagia. 2.  Fibroid uterus. 3.  Meckel's diverticulum.  PROCEDURE:  Total abdominal hysterectomy, bilateral salpingectomy.  ANESTHESIA:  General endotracheal anesthesia.  SURGEON:  Boykin Nearing, MD  FIRST ASSISTANT:  Dr. Benjaman Kindler.  PA STUDENT:  [place].  INDICATIONS:  A 36 year old female with a long history of menorrhagia, uncontrolled by conservative therapy.  The patient is noted to have a 5 cm intramural fibroid on ultrasound.  DESCRIPTION OF PROCEDURE:  After general endotracheal anesthesia, the patient was placed in dorsal supine position with the legs in the Gideon stirrups.  The patient's abdomen, perineum and vagina were prepped and draped in normal sterile fashion.  The  patient did receive 2 grams of IV Ancef and 500 mg Flagyl for surgical prophylaxis prior to commencement of the case.  Timeout was performed.  Foley catheter was placed into the bladder for the procedure.  A Pfannenstiel incision was made 2  fingerbreadths above the symphysis pubis.  Sharp dissection was used to identify the fascia.  Fascia was opened in the midline and opened in a transverse fashion.  Superior aspect of the fascia was grasped with Kocher clamps and recti muscles were  dissected free.  Inferior aspect of the fascia was grasped with Kocher clamps and pyramidalis muscle was dissected free.  Entry into the peritoneal cavity was accomplished sharply.  The O'Connor-O'Sullivan retractor was placed into the incision and the  bowel was packed cephalad with moist laparotomy sponges.  Two large Kelly clamps were placed on the uterine cornua and were used for  retraction.  The round ligaments on both sides were clamped, transected and suture ligated with 0 Vicryl suture.   Anterior leaf of the broad ligament was then incised along the bladder with reflection to the midline from both sides.  Sharp and blunt dissection allowed for dissecting the bladder off the lower uterine segment.  Windows were found in the broad ligament  and the uteroovarian ligaments were bilaterally clamped, transected and suture ligated with 0 Vicryl suture.  Uterine arteries were then skeletonized bilaterally, clamped with Heaney-Ballentine clamps, transected, and suture ligated with 0 Vicryl  suture.  Vaginal angles were then clamped with Heaney-Ballentine clamp and the entry into the vagina ensued and the cervix with uterus were removed.  Vaginal cuff was then closed with interrupted 0 Vicryl suture.  Good hemostasis was noted.  Each  fallopian tube was then grasped with Babcock clamps and distal portion of fallopian tube was clamped, transected and suture ligated with 0 Vicryl suture.  Good hemostasis noted. During the procedure, there was a small bowel anomaly that was identified.   It was consistent with a Meckel's diverticulum.  Dr. Dahlia Byes and Dr. Hampton Abbot, general surgeons came into the operation to evaluate during the procedure.  After their discussion, they felt that removal of the Meckel's diverticulum was not warranted.  It  appeared that this diverticulum was approximately 10 cm in length without obvious deformity at the distal portion.  It was estimated that the diverticulum was at least 2 feet from the ileocecal valve.  General surgeon said that they would follow up  postoperatively to further discuss findings and potential issues in the future.  The patient's abdomen was then  copiously irrigated.  Good hemostasis was noted and the fascia was then closed with 0 Vicryl suture running nonlocking fashion.  Two separate  sutures were used of 0 Vicryl.  Fascial edges were injected  with a solution of 0.5% Marcaine 60 mL plus 20 mL normal saline.  Approximately 40 mL of the solution was injected.  Subcutaneous tissues were irrigated and bovied for hemostasis and given the  depth of subcutaneous tissues of approximately 5 cm, this dead space was closed with a running 2-0 chromic suture.  Skin was reapproximated with Insorb absorbable staples with good cosmetic effect.  Additional 30 mL of Marcaine solution was injected  beneath the skin and the procedure was terminated.  INTRAOPERATIVE FLUIDS:  1 liter.  ESTIMATED BLOOD LOSS:  150 mL  URINE OUTPUT:  250 mL  The patient tolerated the procedure well and was taken to recovery room in good condition.   VAI D: 08/13/2022 9:05:05 am T: 08/13/2022 9:32:00 am  JOB: [id number]/ [id number]

## 2022-09-21 ENCOUNTER — Ambulatory Visit (INDEPENDENT_AMBULATORY_CARE_PROVIDER_SITE_OTHER): Payer: Medicaid Other | Admitting: Surgery

## 2022-09-21 ENCOUNTER — Encounter: Payer: Self-pay | Admitting: Surgery

## 2022-09-21 VITALS — BP 140/91 | HR 87 | Temp 98.3°F | Ht 67.5 in | Wt 264.0 lb

## 2022-09-21 DIAGNOSIS — Q430 Meckel's diverticulum (displaced) (hypertrophic): Secondary | ICD-10-CM

## 2022-09-21 NOTE — Progress Notes (Signed)
09/21/2022  Reason for Visit: Meckel's diverticulum  Requesting Provider: Laverta [place], MD  History of Present Illness: Kelli Morrison is a 37 y.o. female presenting for evaluation of an incidental Meckel's diverticulum found intraoperatively.  She underwent an open total abdominal hysterectomy and bilateral salpingectomy on 08/10/22 for menorrhagia and fibroid uterus.  Intraoperatively, she was noted to have a Meckel's diverticulum which was in the distal ileum, and measured about 10 cm in length, without any palpable abnormality.  It was unclear if this was symptomatic at the time and given that I had not discussed the surgery with the patient and the potential risks associated with resection, it was decided not to intervene intraoperatively.  She has been recovering well from her surgery and reports that her mobility has been easier and her pain is much improved.  She reports that she has had issues with acid reflux for a long time and sometimes would have abdominal pain.  Sometimes the pain would be in the mid abdomen or right lower side, or epigastric areas.  It is unclear if the pain could have been from the fibroids or from the [place].  She was unaware of the Meckel's diverticulum until her surgery.  Denies any GI bleeding episodes.  Past Medical History: Past Medical History:  Diagnosis Date   Anemia    Asthma    Elevated blood pressure reading    going to see pcp on 08-07-22 since she has had some elevated bp readings   Family history of adverse reaction to anesthesia    dad n/v   GERD (gastroesophageal reflux disease)    Headache    migraines     Past Surgical History: Past Surgical History:  Procedure Laterality Date   CESAREAN SECTION     HYSTERECTOMY ABDOMINAL WITH SALPINGECTOMY Bilateral 08/09/2022   Procedure: HYSTERECTOMY ABDOMINAL WITH SALPINGECTOMY;  Surgeon: Boykin Nearing, MD;  Location: [facility] ORS;  Service: Gynecology;  Laterality: Bilateral;    WISDOM TOOTH EXTRACTION      Home Medications: Prior to Admission medications   Medication Sig Start Date End Date Taking? Authorizing Provider  albuterol (VENTOLIN HFA) 108 (90 Base) MCG/ACT inhaler Inhale 2 puffs into the lungs every 6 (six) hours as needed. 12/11/21  Yes Rodriguez-Southworth, Sunday Spillers, PA-C  budesonide-formoterol (SYMBICORT) 160-4.5 MCG/ACT inhaler Inhale 2 puffs into the lungs 2 (two) times daily.   Yes [provider]  cetirizine (ZYRTEC) 10 MG tablet Take 10 mg by mouth at bedtime.   Yes [provider]  docusate sodium (COLACE) 100 MG capsule Take 1 capsule (100 mg total) by mouth 2 (two) times daily. To keep stools soft 08/10/22  Yes Benjaman Kindler, MD  esomeprazole (NEXIUM) 20 MG packet Take 20 mg by mouth daily before breakfast.   Yes [provider]  gabapentin (NEURONTIN) 600 MG tablet Take 1 tablet (600 mg total) by mouth at bedtime. 08/10/22  Yes Benjaman Kindler, MD  losartan (COZAAR) 25 MG tablet Take 12.5 mg by mouth daily.   Yes Concepcion Elk, MD  montelukast (SINGULAIR) 10 MG tablet Take 10 mg by mouth every morning.   Yes [provider]  Multiple Vitamin (MULTIVITAMIN) tablet Take 1 tablet by mouth daily.   Yes [provider]  oxyCODONE (OXY IR/ROXICODONE) 5 MG immediate release tablet Take 1 tablet (5 mg total) by mouth every 4 (four) hours as needed for severe pain. 08/10/22  Yes Benjaman Kindler, MD    Allergies: No Known Allergies  Social History:  reports that she  has been smoking cigarettes. She has a 11.00 pack-year smoking history. She has never used smokeless tobacco. She reports current alcohol use. She reports that she does not currently use drugs after having used the following drugs: Marijuana.   Family History: Family History  Problem Relation Age of Onset   Hypertension Mother    Hypertension Father    Heart failure Maternal Grandfather     Review of Systems: Review of Systems   Constitutional:  Negative for chills and fever.  HENT:  Negative for hearing loss.   Respiratory:  Negative for shortness of breath.   Cardiovascular:  Negative for chest pain.  Gastrointestinal:  Positive for abdominal pain. Negative for nausea and vomiting.  Genitourinary:  Negative for dysuria.  Musculoskeletal:  Negative for myalgias.  Skin:  Negative for rash.  Neurological:  Negative for dizziness.  Psychiatric/Behavioral:  Negative for depression.     Physical Exam BP (!) 140/91   Pulse 87   Temp 98.3 F (36.8 C) (Oral)   Ht 5' 7.5" (1.715 m)   Wt 264 lb (119.7 kg)   SpO2 97%   BMI 40.74 kg/m  CONSTITUTIONAL: No acute distress, well-nourished HEENT:  Normocephalic, atraumatic, extraocular motion intact. NECK: Trachea is midline, and there is no jugular venous distension.  RESPIRATORY:  Lungs are clear, and breath sounds are equal bilaterally. Normal respiratory effort without pathologic use of accessory muscles. CARDIOVASCULAR: Heart is regular without murmurs, gallops, or rubs. GI: The abdomen is soft, nondistended, with some discomfort to palpation in the lower abdomen from her previous surgery.  Pfannenstiel incision is healing well and is clean, dry, intact.  MUSCULOSKELETAL:  Normal muscle strength and tone in all four extremities.  No peripheral edema or cyanosis. SKIN: Skin turgor is normal. There are no pathologic skin lesions.  NEUROLOGIC:  Motor and sensation is grossly normal.  Cranial nerves are grossly intact. PSYCH:  Alert and oriented to person, place and time. Affect is normal.  Laboratory Analysis: Labs on 08/03/2022: Sodium 141, potassium 4, chloride 107, CO2 26, BUN 14, creatinine 0.75.  WBC 11.2, hemoglobin 12.3, hematocrit 37.2, platelets 376.  Imaging: No results found.  Assessment and Plan: This is a 37 y.o. female with Meckel's diverticulum.  -The patient was unaware that she had a Meckel's diverticulum prior to surgery.  When reviewing a CT  scan of abdomen/pelvis from 02/2017, it is unclear if there is a diverticulum or not, and it was done without oral contrast.  She has long standing acid reflux issues and reports that she currently takes Nexium.  Discussed how sometimes the Meckel's diverticulum could have gastric mucosa, which could have similar ulcer formation complications and perforation.  This could potentially could be a source of her pain in the past.  Also, given the size of the diverticulum, discussed with the patient the benefit from resection.  She's in agreement. --Discussed then the plan for a robotic assisted small bowel resection to remove the Mecke's diverticulum.  Reviewed the surgery at length with her including the planned incisions, the risks of bleeding, infection, injury to surrounding structures, hospital stay, pain control, post-operative activity restrictions, and she's willing to proceed. --Given her recent surgery, will schedule her for 12/10/22 to allow her a full recovery prior to this surgery.  Patient will follow up with me in March for H&P update.  I spent 55 minutes dedicated to the care of this patient on the date of this encounter to include pre-visit review of records, face-to-face time with the  patient discussing diagnosis and management, and any post-visit coordination of care.   Melvyn Neth, [facility]

## 2022-09-21 NOTE — Patient Instructions (Signed)
Minimally Invasive Small Bowel Resection The small bowel, also called the small intestine, is the first part of the intestines. When food leaves the stomach, it enters the small bowel. Nutrients can then be absorbed into the body. Minimally invasive small bowel resection is surgery to remove part of the small bowel. You may need this procedure if you have: Blockage (obstruction). Inflammatory bowel disease, such as Crohn's disease. Cancer. A hole in the bowel (perforation). An injury to the bowel (trauma). Tell your health care provider about: Any allergies you have. All medicines you are taking, including vitamins, herbs, eye drops, creams, and over-the-counter medicines. Any problems you or family members have had with anesthesia. Any bleeding problems you have. Any surgeries you have had. Any medical conditions you have. Whether you are pregnant or may be pregnant. What are the risks? Your health care provider will talk with you about risks. These may include: Infection. Bleeding. Allergic reactions to medicines. Damage to nearby structures or organs. Problems with the bowel, such as: Fluids leaking from the intestines into the abdomen. A long delay before the bowels start to work again (ileus). Not absorbing enough vitamins and nutrients through the small bowel. Short bowel syndrome. This is when there is not enough of the small bowel left to absorb nutrients. Pulmonary embolism. This is a blood clot that forms in the veins and travels to the lungs. Hernia. This is when the abdomen bulges out. It may require surgery in the future. Scarring around an incision or inside your body. If scarring occurs inside the body, you may need surgery. What happens before the procedure? When to stop eating and drinking Follow instructions from your health care provider about what you may eat and drink. These may include: 8 hours before your procedure Stop eating most foods. Do not eat meat,  fried foods, or fatty foods. Eat only light foods, such as toast or crackers. All liquids are okay except energy drinks and alcohol. 6 hours before your procedure Stop eating. Drink only clear liquids, such as water, clear fruit juice, black coffee, plain tea, and sports drinks. Do not drink energy drinks or alcohol. 2 hours before your procedure Stop drinking all liquids. You may be allowed to take medicines with small sips of water. If you do not follow your health care provider's instructions, your procedure may be delayed or canceled. Medicines Ask your health care provider about: Changing or stopping your regular medicines. These include any diabetes medicines or blood thinners you take. Taking medicines such as aspirin and ibuprofen. These medicines can thin your blood. Do not take them unless your health care provider tells you to. Taking over-the-counter medicines, vitamins, herbs, and supplements. You may need to take medicine to clean out your bowels before surgery (bowel prep). Take the medicine as told by your health care provider. Tests You may have an exam or testing. Tests may include: Blood tests. Imaging tests, such as X-rays, CT scan, or MRI. Surgery safety Ask your health care provider: How your surgery site will be marked. What steps will be taken to help prevent infection. These steps may include: Removing hair at the surgery site. Washing skin with a soap that kills germs. Taking antibiotics. General instructions Do not use any products that contain nicotine or tobacco for at least 4 weeks before the procedure. These products include cigarettes, chewing tobacco, and vaping devices, such as e-cigarettes. If you need help quitting, ask your health care provider. Plan to have a responsible adult: Take you  home from the hospital. You will not be allowed to drive. Care for you for the time you are told. What happens during the procedure?  An IV will be inserted  into one of your veins. You may be given: A sedative. This helps you relax. Anesthesia. This will: Numb certain areas of your body. Make you fall asleep for surgery. Tubes may be put into your body, such as: A tube in your throat to help you breathe. A nasogastric tube (NG tube). This goes through your nose and into your stomach. Fluids from your stomach will drain through this tube during and after the procedure. A small, thin tube (catheter) put into your bladder to drain urine. A few small incisions will be made in your abdomen. Hollow tubes called trocars will be inserted into the incisions. A scope with a light and camera on the end (laparoscope) will be inserted into one of the trocars. This can be used to send pictures to a screen. Your abdomen will be filled with gas. This gives your surgeon room to see and work. The tools needed for the procedure will be inserted through the other trocars. A surgical robot may be used to control the laparoscope and tools (robot-assisted laparoscopic procedure). The affected part of the small bowel will be removed. The two healthy ends of the small bowel will be connected with staples or stitches (sutures). If the ends cannot be joined, you may need an ileostomy. This is when an opening (stoma) is made in your abdomen for stool to pass through. The incisions will be closed with sutures, skin glue, or adhesive strips and may be covered with bandages (dressings). The procedure may vary among health care providers and hospitals. What happens after the procedure? Your blood pressure, heart rate, breathing rate, and blood oxygen level will be monitored until you leave the hospital. You may be given fluids and medicine through an IV. The NG tube will be removed after your bowels start working. Then you can start to have liquids and slowly move to more solid foods. You should get up and start walking within a day. This helps prevent blood clots in your  legs. To help prevent breathing problems: You may be told to breathe deeply and cough several times a day. You may be given a device (incentive spirometer) to help you do breathing exercises. You may be told to wear compression stockings. These help prevent blood clots and reduce swelling in your legs. This information is not intended to replace advice given to you by your health care provider. Make sure you discuss any questions you have with your health care provider. Document Revised: 01/19/2022 Document Reviewed: 01/19/2022 Elsevier Patient Education  [place].

## 2022-09-25 ENCOUNTER — Telehealth: Payer: Self-pay | Admitting: Surgery

## 2022-09-25 NOTE — Telephone Encounter (Signed)
Left message for patient to call, please inform her of the following regarding scheduled surgery with Dr. Hampton Abbot.   Pre-Admission date/time, and Surgery date at [facility].  Surgery Date: 12/06/22 Preadmission Testing Date: 11/27/22 (phone 8a-1p)  Also patient will need to call at [phone], between 1-3:00pm the day before surgery, to find out what time to arrive for surgery.

## 2022-09-26 ENCOUNTER — Encounter: Payer: Self-pay | Admitting: Surgery

## 2022-09-26 NOTE — Telephone Encounter (Signed)
Updated information regarding rescheduled surgery at patient's request with Dr. Hampton Abbot.   Pre-Admission date/time, and Surgery date at [facility].  Surgery Date: 12/13/22 Preadmission Testing Date: 12/07/22 (phone 8a-1p)  Patient has been made aware to call [phone], between 1-3:00pm the day before surgery, to find out what time to arrive for surgery.

## 2022-11-23 ENCOUNTER — Ambulatory Visit (INDEPENDENT_AMBULATORY_CARE_PROVIDER_SITE_OTHER): Payer: Medicaid Other | Admitting: Surgery

## 2022-11-23 ENCOUNTER — Encounter: Payer: Self-pay | Admitting: Surgery

## 2022-11-23 VITALS — BP 122/80 | HR 84 | Temp 98.0°F | Ht 67.5 in | Wt 263.0 lb

## 2022-11-23 DIAGNOSIS — Q430 Meckel's diverticulum (displaced) (hypertrophic): Secondary | ICD-10-CM | POA: Diagnosis not present

## 2022-11-23 NOTE — Progress Notes (Signed)
11/23/2022  History of Present Illness: Kelli Morrison is a 37 y.o. female presenting for H&P update in preparation for robotic assisted small bowel resection for Meckel's diverticulum.  The patient is status post total abdominal hysterectomy and bilateral salpingectomy with Dr. Ouida Sills on 08/09/2022 during which time a long Meckel's diverticulum was found.  Today the patient reports that she is recovering well from her surgery and denies the same pelvic pain that she was having before.  She does still have some intermittent right lower quadrant pain which may be related to the Meckel's diverticulum.  Otherwise she is having normal bowel function, eating well, and without any concerns currently.  Past Medical History: Past Medical History:  Diagnosis Date   Anemia    Asthma    Elevated blood pressure reading    going to see pcp on 08-07-22 since she has had some elevated bp readings   Family history of adverse reaction to anesthesia    dad n/v   GERD (gastroesophageal reflux disease)    Headache    migraines     Past Surgical History: Past Surgical History:  Procedure Laterality Date   CESAREAN SECTION     HYSTERECTOMY ABDOMINAL WITH SALPINGECTOMY Bilateral 08/09/2022   Procedure: HYSTERECTOMY ABDOMINAL WITH SALPINGECTOMY;  Surgeon: Boykin Nearing, MD;  Location: [facility] ORS;  Service: Gynecology;  Laterality: Bilateral;   WISDOM TOOTH EXTRACTION      Home Medications: Prior to Admission medications   Medication Sig Start Date End Date Taking? Authorizing Provider  albuterol (VENTOLIN HFA) 108 (90 Base) MCG/ACT inhaler Inhale 2 puffs into the lungs every 6 (six) hours as needed. 12/11/21  Yes Rodriguez-Southworth, Sunday Spillers, PA-C  budesonide-formoterol (SYMBICORT) 160-4.5 MCG/ACT inhaler Inhale 2 puffs into the lungs 2 (two) times daily.   Yes [provider]  cetirizine (ZYRTEC) 10 MG tablet Take 10 mg by mouth at bedtime.   Yes [provider]  docusate  sodium (COLACE) 100 MG capsule Take 1 capsule (100 mg total) by mouth 2 (two) times daily. To keep stools soft 08/10/22  Yes Benjaman Kindler, MD  esomeprazole (NEXIUM) 20 MG packet Take 20 mg by mouth daily before breakfast.   Yes [provider]  FLUoxetine (PROZAC) 20 MG capsule Take 20 mg by mouth daily. 11/08/22 11/08/23 Yes [provider]  gabapentin (NEURONTIN) 600 MG tablet Take 1 tablet (600 mg total) by mouth at bedtime. 08/10/22  Yes Benjaman Kindler, MD  losartan (COZAAR) 25 MG tablet Take 12.5 mg by mouth daily.   Yes Concepcion Elk, MD  montelukast (SINGULAIR) 10 MG tablet Take 10 mg by mouth every morning.   Yes [provider]  Multiple Vitamin (MULTIVITAMIN) tablet Take 1 tablet by mouth daily.   Yes [provider]  Secukinumab (COSENTYX, 300 MG DOSE, [place]) Inject 300 mg into the muscle once a week. 11/15/22  Yes [provider]    Allergies: No Known Allergies  Review of Systems: Review of Systems  Constitutional:  Negative for chills and fever.  Respiratory:  Negative for shortness of breath.   Cardiovascular:  Negative for chest pain.  Gastrointestinal:  Positive for abdominal pain (Intermittent right lower quadrant discomfort). Negative for nausea and vomiting.  Genitourinary:  Negative for dysuria.    Physical Exam BP 122/80   Pulse 84   Temp 98 F (36.7 C)   Ht 5' 7.5" (1.715 m)   Wt 263 lb (119.3 kg)   LMP 07/10/2022 (Exact Date)   SpO2 100%   BMI  40.58 kg/m  CONSTITUTIONAL: No acute distress HEENT:  Normocephalic, atraumatic, extraocular motion intact. RESPIRATORY:  Lungs are clear, and breath sounds are equal bilaterally. Normal respiratory effort without pathologic use of accessory muscles. CARDIOVASCULAR: Heart is regular without murmurs, gallops, or rubs. GI: The abdomen is, nondistended, with some discomfort in the right lower quadrant.  No peritonitis.  Prior C-section and also hysterectomy scars are very  well-healed with no evidence of hernias.   NEUROLOGIC:  Motor and sensation is grossly normal.  Cranial nerves are grossly intact. PSYCH:  Alert and oriented to person, place and time. Affect is normal.   Assessment and Plan: This is a 37 y.o. female with Meckel's diverticulum.  - Patient is currently scheduled for surgery on 12/13/2022.  Discussed with her again the plan for a robotic assisted small bowel resection and reviewed the surgery at length with her including the planned incisions, risks of bleeding, infection, injury to surrounding structures, hospital stay, pain control, the use of ICG to better evaluate the arterial flow to the small bowel prior to anastomosis, activity restrictions, and she is willing to proceed. - All of her questions have been answered.  I spent 20 minutes dedicated to the care of this patient on the date of this encounter to include pre-visit review of records, face-to-face time with the patient discussing diagnosis and management, and any post-visit coordination of care.   Melvyn Neth, [facility]

## 2022-11-23 NOTE — H&P (View-Only) (Signed)
11/23/2022  History of Present Illness: Kelli Morrison is a 37 y.o. female presenting for H&P update in preparation for robotic assisted small bowel resection for Meckel's diverticulum.  The patient is status post total abdominal hysterectomy and bilateral salpingectomy with Dr. Schermerhorn on 08/09/2022 during which time a long Meckel's diverticulum was found.  Today the patient reports that she is recovering well from her surgery and denies the same pelvic pain that she was having before.  She does still have some intermittent right lower quadrant pain which may be related to the Meckel's diverticulum.  Otherwise she is having normal bowel function, eating well, and without any concerns currently.  Past Medical History: Past Medical History:  Diagnosis Date   Anemia    Asthma    Elevated blood pressure reading    going to see pcp on 08-07-22 since she has had some elevated bp readings   Family history of adverse reaction to anesthesia    dad n/v   GERD (gastroesophageal reflux disease)    Headache    migraines     Past Surgical History: Past Surgical History:  Procedure Laterality Date   CESAREAN SECTION     HYSTERECTOMY ABDOMINAL WITH SALPINGECTOMY Bilateral 08/09/2022   Procedure: HYSTERECTOMY ABDOMINAL WITH SALPINGECTOMY;  Surgeon: Schermerhorn, Thomas J, MD;  Location: [facility] ORS;  Service: Gynecology;  Laterality: Bilateral;   WISDOM TOOTH EXTRACTION      Home Medications: Prior to Admission medications   Medication Sig Start Date End Date Taking? Authorizing Provider  albuterol (VENTOLIN HFA) 108 (90 Base) MCG/ACT inhaler Inhale 2 puffs into the lungs every 6 (six) hours as needed. 12/11/21  Yes Rodriguez-Southworth, Sylvia, PA-C  budesonide-formoterol (SYMBICORT) 160-4.5 MCG/ACT inhaler Inhale 2 puffs into the lungs 2 (two) times daily.   Yes [provider]  cetirizine (ZYRTEC) 10 MG tablet Take 10 mg by mouth at bedtime.   Yes [provider]  docusate  sodium (COLACE) 100 MG capsule Take 1 capsule (100 mg total) by mouth 2 (two) times daily. To keep stools soft 08/10/22  Yes Beasley, Bethany, MD  esomeprazole (NEXIUM) 20 MG packet Take 20 mg by mouth daily before breakfast.   Yes [provider]  FLUoxetine (PROZAC) 20 MG capsule Take 20 mg by mouth daily. 11/08/22 11/08/23 Yes [provider]  gabapentin (NEURONTIN) 600 MG tablet Take 1 tablet (600 mg total) by mouth at bedtime. 08/10/22  Yes Beasley, Bethany, MD  losartan (COZAAR) 25 MG tablet Take 12.5 mg by mouth daily.   Yes Ybanez, Jane, MD  montelukast (SINGULAIR) 10 MG tablet Take 10 mg by mouth every morning.   Yes [provider]  Multiple Vitamin (MULTIVITAMIN) tablet Take 1 tablet by mouth daily.   Yes [provider]  Secukinumab (COSENTYX, 300 MG DOSE, [place]) Inject 300 mg into the muscle once a week. 11/15/22  Yes [provider]    Allergies: No Known Allergies  Review of Systems: Review of Systems  Constitutional:  Negative for chills and fever.  Respiratory:  Negative for shortness of breath.   Cardiovascular:  Negative for chest pain.  Gastrointestinal:  Positive for abdominal pain (Intermittent right lower quadrant discomfort). Negative for nausea and vomiting.  Genitourinary:  Negative for dysuria.    Physical Exam BP 122/80   Pulse 84   Temp 98 F (36.7 C)   Ht 5' 7.5" (1.715 m)   Wt 263 lb (119.3 kg)   LMP 07/10/2022 (Exact Date)   SpO2 100%   BMI   40.58 kg/m  CONSTITUTIONAL: No acute distress HEENT:  Normocephalic, atraumatic, extraocular motion intact. RESPIRATORY:  Lungs are clear, and breath sounds are equal bilaterally. Normal respiratory effort without pathologic use of accessory muscles. CARDIOVASCULAR: Heart is regular without murmurs, gallops, or rubs. GI: The abdomen is, nondistended, with some discomfort in the right lower quadrant.  No peritonitis.  Prior C-section and also hysterectomy scars are very  well-healed with no evidence of hernias.   NEUROLOGIC:  Motor and sensation is grossly normal.  Cranial nerves are grossly intact. PSYCH:  Alert and oriented to person, place and time. Affect is normal.   Assessment and Plan: This is a 37 y.o. female with Meckel's diverticulum.  - Patient is currently scheduled for surgery on 12/13/2022.  Discussed with her again the plan for a robotic assisted small bowel resection and reviewed the surgery at length with her including the planned incisions, risks of bleeding, infection, injury to surrounding structures, hospital stay, pain control, the use of ICG to better evaluate the arterial flow to the small bowel prior to anastomosis, activity restrictions, and she is willing to proceed. - All of her questions have been answered.  I spent 20 minutes dedicated to the care of this patient on the date of this encounter to include pre-visit review of records, face-to-face time with the patient discussing diagnosis and management, and any post-visit coordination of care.   Armaan Pond Luis Jadis Pitter, MD [facility]     

## 2022-11-23 NOTE — Patient Instructions (Signed)
You are scheduled for surgery on 12/13/22 at [facility] with Dr Hampton Abbot. Please refer to your [facility] sheet for more information.  Please call us with any questions.

## 2022-11-27 ENCOUNTER — Other Ambulatory Visit: Payer: Medicaid Other

## 2022-12-07 ENCOUNTER — Encounter
Admission: RE | Admit: 2022-12-07 | Discharge: 2022-12-07 | Disposition: A | Discharge status: Home / Self Care | Payer: Medicaid Other | Source: Ambulatory Visit | Attending: Surgery | Admitting: Surgery

## 2022-12-07 VITALS — Ht 67.5 in | Wt 262.8 lb

## 2022-12-07 DIAGNOSIS — Z01810 Encounter for preprocedural cardiovascular examination: Secondary | ICD-10-CM

## 2022-12-07 DIAGNOSIS — I10 Essential (primary) hypertension: Secondary | ICD-10-CM

## 2022-12-07 HISTORY — DX: Meckel's diverticulum (displaced) (hypertrophic): Q43.0

## 2022-12-07 HISTORY — DX: Cannabis use, unspecified, uncomplicated: F12.90

## 2022-12-07 HISTORY — DX: Nicotine dependence, unspecified, uncomplicated: F17.200

## 2022-12-07 NOTE — Patient Instructions (Signed)
Your procedure is scheduled on:12-13-22 Thursday Report to the Registration Desk on the 1st floor of the Medical Mall.Then proceed to the 2nd floor Surgery Desk To find out your arrival time, please call [phone] between 1PM - 3PM on:12-12-22 Wednesday If your arrival time is 6:00 am, do not arrive before that time as the Medical Mall entrance doors do not open until 6:00 am.  REMEMBER: Instructions that are not followed completely may result in serious medical risk, up to and including death; or upon the discretion of your surgeon and anesthesiologist your surgery may need to be rescheduled.  Do not eat food after midnight the night before surgery.  No gum chewing or hard candies.  You may however, drink CLEAR liquids up to 2 hours before you are scheduled to arrive for your surgery. Do not drink anything within 2 hours of your scheduled arrival time.  Clear liquids include: - water  - apple juice without pulp - gatorade (not RED colors) - black coffee or tea (Do NOT add milk or creamers to the coffee or tea) Do NOT drink anything that is not on this list  One week prior to surgery: Stop Anti-inflammatories (NSAIDS) such as Advil, Aleve, Ibuprofen, Motrin, Naproxen, Naprosyn and Aspirin based products such as Excedrin, Goody's Powder, BC Powder.You may however, take Tylenol if needed for pain up until the day of surgery.  Stop ANY OVER THE COUNTER supplements/vitamins NOW (12-07-22) until after surgery (Multivitamin)-Continue your Vitamin D  TAKE ONLY THESE MEDICATIONS THE MORNING OF SURGERY WITH A SIP OF WATER: -FLUoxetine (PROZAC)  -montelukast (SINGULAIR)  -esomeprazole (NEXIUM)-take one the night before and one on the morning of surgery - helps to prevent nausea after surgery.) -You may take ALPRAZolam Prudy Feeler)  the morning of surgery if needed for anxiety  Use your Symbicort and Albuterol Inhaler the day of surgery and bring your Albuterol Inhaler to the hospital  No Alcohol  for 24 hours before or after surgery.  No Smoking including e-cigarettes for 24 hours before surgery.  No chewable tobacco products for at least 6 hours before surgery.  No nicotine patches on the day of surgery.  Do not use any "recreational" drugs for at least a week (preferably 2 weeks) before your surgery.  Please be advised that the combination of cocaine and anesthesia may have negative outcomes, up to and including death. If you test positive for cocaine, your surgery will be cancelled.  On the morning of surgery brush your teeth with toothpaste and water, you may rinse your mouth with mouthwash if you wish. Do not swallow any toothpaste or mouthwash.  Use CHG Soap or wipes as directed on instruction sheet.  Do not wear jewelry, make-up, hairpins, clips or nail polish.  Do not wear lotions, powders, or perfumes.   Do not shave body hair from the neck down 48 hours before surgery.  Contact lenses, hearing aids and dentures may not be worn into surgery.  Do not bring valuables to the hospital. [facility] is not responsible for any missing/lost belongings or valuables.  Notify your doctor if there is any change in your medical condition (cold, fever, infection).  Wear comfortable clothing (specific to your surgery type) to the hospital.  After surgery, you can help prevent lung complications by doing breathing exercises.  Take deep breaths and cough every 1-2 hours. Your doctor may order a device called an Incentive Spirometer to help you take deep breaths. When coughing or sneezing, hold a pillow firmly against your  incision with both hands. This is called "splinting." Doing this helps protect your incision. It also decreases belly discomfort.  If you are being admitted to the hospital overnight, leave your suitcase in the car. After surgery it may be brought to your room.  In case of increased patient census, it may be necessary for you, the patient, to continue your  postoperative care in the Same Day Surgery department.  If you are being discharged the day of surgery, you will not be allowed to drive home. You will need a responsible individual to drive you home and stay with you for 24 hours after surgery.   If you are taking public transportation, you will need to have a responsible individual with you.  Please call the Pre-admissions Testing Dept. at [phone] if you have any questions about these instructions.  Surgery Visitation Policy:  Patients having surgery or a procedure may have two visitors.  Children under the age of 37 must have an adult with them who is not the patient.  Inpatient Visitation:    Visiting hours are 7 a.m. to 8 p.m. Up to four visitors are allowed at one time in a patient room. The visitors may rotate out with other people during the day.  One visitor age 316 or older may stay with the patient overnight and must be in the room by 8 p.m.     Preparing for Surgery with CHLORHEXIDINE GLUCONATE (CHG) Soap  Chlorhexidine Gluconate (CHG) Soap  o An antiseptic cleaner that kills germs and bonds with the skin to continue killing germs even after washing  o Used for showering the night before surgery and morning of surgery  Before surgery, you can play an important role by reducing the number of germs on your skin.  CHG (Chlorhexidine gluconate) soap is an antiseptic cleanser which kills germs and bonds with the skin to continue killing germs even after washing.  Please do not use if you have an allergy to CHG or antibacterial soaps. If your skin becomes reddened/irritated stop using the CHG.  1. Shower the NIGHT BEFORE SURGERY and the MORNING OF SURGERY with CHG soap.  2. If you choose to wash your hair, wash your hair first as usual with your normal shampoo.  3. After shampooing, rinse your hair and body thoroughly to remove the shampoo.  4. Use CHG as you would any other liquid soap. You can apply CHG  directly to the skin and wash gently with a scrungie or a clean washcloth.  5. Apply the CHG soap to your body only from the neck down. Do not use on open wounds or open sores. Avoid contact with your eyes, ears, mouth, and genitals (private parts). Wash face and genitals (private parts) with your normal soap.  6. Wash thoroughly, paying special attention to the area where your surgery will be performed.  7. Thoroughly rinse your body with warm water.  8. Do not shower/wash with your normal soap after using and rinsing off the CHG soap.  9. Pat yourself dry with a clean towel.  10. Wear clean pajamas to bed the night before surgery.  12. Place clean sheets on your bed the night of your first shower and do not sleep with pets.  13. Shower again with the CHG soap on the day of surgery prior to arriving at the hospital.  14. Do not apply any deodorants/lotions/powders.  15. Please wear clean clothes to the hospital.

## 2022-12-10 ENCOUNTER — Encounter
Admission: RE | Admit: 2022-12-10 | Discharge: 2022-12-10 | Disposition: A | Discharge status: Home / Self Care | Payer: Medicaid Other | Source: Ambulatory Visit | Attending: Surgery | Admitting: Surgery

## 2022-12-10 DIAGNOSIS — Q430 Meckel's diverticulum (displaced) (hypertrophic): Secondary | ICD-10-CM | POA: Insufficient documentation

## 2022-12-10 DIAGNOSIS — I10 Essential (primary) hypertension: Secondary | ICD-10-CM | POA: Diagnosis not present

## 2022-12-10 DIAGNOSIS — Z01810 Encounter for preprocedural cardiovascular examination: Secondary | ICD-10-CM

## 2022-12-10 DIAGNOSIS — Z01818 Encounter for other preprocedural examination: Secondary | ICD-10-CM | POA: Insufficient documentation

## 2022-12-10 LAB — BASIC METABOLIC PANEL
Anion gap: 10 (ref 5–15)
BUN: 11 mg/dL (ref 6–20)
CO2: 22 mmol/L (ref 22–32)
Calcium: 8.7 mg/dL — ABNORMAL LOW (ref 8.9–10.3)
Chloride: 105 mmol/L (ref 98–111)
Creatinine, Ser: 0.69 mg/dL (ref 0.44–1.00)
GFR, Estimated: >60 mL/min (ref >60)
Glucose, Bld: 98 mg/dL (ref 70–99)
Potassium: 3.7 mmol/L (ref 3.5–5.1)
Sodium: 137 mmol/L (ref 135–145)

## 2022-12-10 LAB — CBC WITH DIFFERENTIAL/PLATELET
Abs Immature Granulocytes: 0.05 10*3/uL (ref 0.00–0.07)
Basophils Absolute: 0.1 10*3/uL (ref 0.0–0.1)
Basophils Relative: 1 %
Eosinophils Absolute: 0.4 10*3/uL (ref 0.0–0.5)
Eosinophils Relative: 3 %
HCT: 39.6 % (ref 36.0–46.0)
Hemoglobin: 12.8 g/dL (ref 12.0–15.0)
Immature Granulocytes: 1 %
Lymphocytes Relative: 23 %
Lymphs Abs: 2.4 10*3/uL (ref 0.7–4.0)
MCH: 29.8 pg (ref 26.0–34.0)
MCHC: 32.3 g/dL (ref 30.0–36.0)
MCV: 92.1 fL (ref 80.0–100.0)
Monocytes Absolute: 0.8 10*3/uL (ref 0.1–1.0)
Monocytes Relative: 7 %
Neutro Abs: 6.8 10*3/uL (ref 1.7–7.7)
Neutrophils Relative %: 65 %
Platelets: 346 10*3/uL (ref 150–400)
RBC: 4.3 MIL/uL (ref 3.87–5.11)
RDW: 16.8 % — ABNORMAL HIGH (ref 11.5–15.5)
WBC: 10.4 10*3/uL (ref 4.0–10.5)
nRBC: 0 % (ref 0.0–0.2)

## 2022-12-10 LAB — TYPE AND SCREEN
ABO/RH(D): O POS
Antibody Screen: NEGATIVE

## 2022-12-10 LAB — PROTIME-INR
INR: 1 (ref 0.8–1.2)
Prothrombin Time: 13.4 seconds (ref 11.4–15.2)

## 2022-12-12 MED ORDER — CHLORHEXIDINE GLUCONATE CLOTH 2 % EX PADS
6.0000 | MEDICATED_PAD | Freq: Once | CUTANEOUS | Status: AC
  Administered 2022-12-12: 6 via TOPICAL

## 2022-12-12 MED ORDER — ALVIMOPAN 12 MG PO CAPS
12.0000 mg | ORAL_CAPSULE | ORAL | Status: AC
  Administered 2022-12-13: 12 mg via ORAL

## 2022-12-12 MED ORDER — BUPIVACAINE LIPOSOME 1.3 % IJ SUSP: 20.0000 mL | Freq: Once | INTRAMUSCULAR | Status: DC

## 2022-12-12 MED ORDER — SODIUM CHLORIDE 0.9 % IV SOLN
2.0000 g | INTRAVENOUS | Status: AC
  Administered 2022-12-13: 2 g via INTRAVENOUS

## 2022-12-12 MED ORDER — CHLORHEXIDINE GLUCONATE CLOTH 2 % EX PADS
6.0000 | MEDICATED_PAD | Freq: Once | CUTANEOUS | Status: AC
  Administered 2022-12-13: 6 via TOPICAL

## 2022-12-12 MED ORDER — LACTATED RINGERS IV SOLN: INTRAVENOUS | Status: DC

## 2022-12-12 MED ORDER — ACETAMINOPHEN 500 MG PO TABS
1000.0000 mg | ORAL_TABLET | ORAL | Status: AC
  Administered 2022-12-13: 1000 mg via ORAL

## 2022-12-12 MED ORDER — CHLORHEXIDINE GLUCONATE 0.12 % MT SOLN
15.0000 mL | Freq: Once | OROMUCOSAL | Status: AC
  Administered 2022-12-13: 15 mL via OROMUCOSAL

## 2022-12-12 MED ORDER — GABAPENTIN 300 MG PO CAPS
300.0000 mg | ORAL_CAPSULE | ORAL | Status: AC
  Administered 2022-12-13: 300 mg via ORAL

## 2022-12-12 MED ORDER — ORAL CARE MOUTH RINSE: 15.0000 mL | Freq: Once | OROMUCOSAL | Status: AC

## 2022-12-13 ENCOUNTER — Inpatient Hospital Stay: Payer: Medicaid Other | Admitting: Urgent Care

## 2022-12-13 ENCOUNTER — Other Ambulatory Visit: Payer: Self-pay

## 2022-12-13 ENCOUNTER — Inpatient Hospital Stay
Admission: RE | Admit: 2022-12-13 | Discharge: 2022-12-15 | DRG: 331 | Disposition: A | Discharge status: Home / Self Care | Payer: Medicaid Other | Attending: Surgery | Admitting: Surgery

## 2022-12-13 ENCOUNTER — Encounter: Payer: Self-pay | Admitting: Surgery

## 2022-12-13 ENCOUNTER — Encounter
Admission: RE | Disposition: A | Discharge status: Home / Self Care | Payer: Self-pay | Source: Home / Self Care | Attending: Surgery

## 2022-12-13 DIAGNOSIS — J45909 Unspecified asthma, uncomplicated: Secondary | ICD-10-CM | POA: Diagnosis present

## 2022-12-13 DIAGNOSIS — Q430 Meckel's diverticulum (displaced) (hypertrophic): Principal | ICD-10-CM

## 2022-12-13 DIAGNOSIS — K439 Ventral hernia without obstruction or gangrene: Secondary | ICD-10-CM

## 2022-12-13 DIAGNOSIS — Z7951 Long term (current) use of inhaled steroids: Secondary | ICD-10-CM

## 2022-12-13 DIAGNOSIS — I10 Essential (primary) hypertension: Secondary | ICD-10-CM | POA: Diagnosis present

## 2022-12-13 DIAGNOSIS — K432 Incisional hernia without obstruction or gangrene: Secondary | ICD-10-CM | POA: Diagnosis present

## 2022-12-13 DIAGNOSIS — K219 Gastro-esophageal reflux disease without esophagitis: Secondary | ICD-10-CM | POA: Diagnosis present

## 2022-12-13 DIAGNOSIS — D72829 Elevated white blood cell count, unspecified: Secondary | ICD-10-CM | POA: Diagnosis not present

## 2022-12-13 DIAGNOSIS — K429 Umbilical hernia without obstruction or gangrene: Secondary | ICD-10-CM

## 2022-12-13 DIAGNOSIS — Z79899 Other long term (current) drug therapy: Secondary | ICD-10-CM

## 2022-12-13 HISTORY — PX: INCISIONAL HERNIA REPAIR: SHX193

## 2022-12-13 HISTORY — PX: UMBILICAL HERNIA REPAIR: SHX196

## 2022-12-13 HISTORY — PX: XI ROBOTIC ASSISTED SMALL BOWEL RESECTION: SHX6872

## 2022-12-13 SURGERY — EXCISION, SMALL INTESTINE, ROBOT-ASSISTED
Anesthesia: General

## 2022-12-13 MED ORDER — HYDROMORPHONE HCL 1 MG/ML IJ SOLN
INTRAMUSCULAR | Status: AC
  Filled 2022-12-13: qty 1

## 2022-12-13 MED ORDER — SODIUM CHLORIDE 0.9 % IV SOLN
INTRAVENOUS | Status: AC
  Filled 2022-12-13: qty 2

## 2022-12-13 MED ORDER — ALPRAZOLAM 0.25 MG PO TABS
0.2500 mg | ORAL_TABLET | Freq: Two times a day (BID) | ORAL | Status: DC | PRN
  Administered 2022-12-14 – 2022-12-15 (×2): 0.25 mg via ORAL
  Filled 2022-12-13 (×2): qty 1

## 2022-12-13 MED ORDER — MONTELUKAST SODIUM 10 MG PO TABS
10.0000 mg | ORAL_TABLET | Freq: Every day | ORAL | Status: DC
  Administered 2022-12-14 – 2022-12-15 (×2): 10 mg via ORAL
  Filled 2022-12-13 (×2): qty 1

## 2022-12-13 MED ORDER — BUPIVACAINE LIPOSOME 1.3 % IJ SUSP
INTRAMUSCULAR | Status: AC
  Filled 2022-12-13: qty 20

## 2022-12-13 MED ORDER — LORATADINE 10 MG PO TABS
10.0000 mg | ORAL_TABLET | Freq: Every day | ORAL | Status: DC
  Administered 2022-12-13 – 2022-12-15 (×3): 10 mg via ORAL
  Filled 2022-12-13 (×3): qty 1

## 2022-12-13 MED ORDER — ALBUTEROL SULFATE HFA 108 (90 BASE) MCG/ACT IN AERS
INHALATION_SPRAY | RESPIRATORY_TRACT | Status: DC | PRN
  Administered 2022-12-13: 4 via RESPIRATORY_TRACT

## 2022-12-13 MED ORDER — LACTATED RINGERS IV SOLN: INTRAVENOUS | Status: DC | PRN

## 2022-12-13 MED ORDER — OXYCODONE HCL 5 MG PO TABS
5.0000 mg | ORAL_TABLET | ORAL | Status: DC | PRN
  Administered 2022-12-13 – 2022-12-15 (×9): 10 mg via ORAL
  Filled 2022-12-13 (×11): qty 2

## 2022-12-13 MED ORDER — ACETAMINOPHEN 500 MG PO TABS
1000.0000 mg | ORAL_TABLET | Freq: Four times a day (QID) | ORAL | Status: DC
  Administered 2022-12-13 – 2022-12-15 (×8): 1000 mg via ORAL
  Filled 2022-12-13 (×9): qty 2

## 2022-12-13 MED ORDER — FENTANYL CITRATE (PF) 100 MCG/2ML IJ SOLN
INTRAMUSCULAR | Status: AC
  Filled 2022-12-13: qty 2

## 2022-12-13 MED ORDER — LIDOCAINE HCL (CARDIAC) PF 100 MG/5ML IV SOSY
PREFILLED_SYRINGE | INTRAVENOUS | Status: DC | PRN
  Administered 2022-12-13: 100 mg via INTRAVENOUS

## 2022-12-13 MED ORDER — ONDANSETRON HCL 4 MG/2ML IJ SOLN
INTRAMUSCULAR | Status: DC | PRN
  Administered 2022-12-13: 4 mg via INTRAVENOUS

## 2022-12-13 MED ORDER — ALVIMOPAN 12 MG PO CAPS
ORAL_CAPSULE | ORAL | Status: AC
  Filled 2022-12-13: qty 1

## 2022-12-13 MED ORDER — INDOCYANINE GREEN 25 MG IV SOLR
INTRAVENOUS | Status: DC | PRN
  Administered 2022-12-13: 2.5 mg via INTRAVENOUS

## 2022-12-13 MED ORDER — FLUOXETINE HCL 20 MG PO CAPS
40.0000 mg | ORAL_CAPSULE | Freq: Every day | ORAL | Status: DC
  Administered 2022-12-14 – 2022-12-15 (×2): 40 mg via ORAL
  Filled 2022-12-13 (×2): qty 2

## 2022-12-13 MED ORDER — MIDAZOLAM HCL 2 MG/2ML IJ SOLN
INTRAMUSCULAR | Status: DC | PRN
  Administered 2022-12-13 (×2): 1 mg via INTRAVENOUS

## 2022-12-13 MED ORDER — FENTANYL CITRATE (PF) 100 MCG/2ML IJ SOLN
INTRAMUSCULAR | Status: DC | PRN
  Administered 2022-12-13 (×2): 50 ug via INTRAVENOUS

## 2022-12-13 MED ORDER — ACETAMINOPHEN 500 MG PO TABS
ORAL_TABLET | ORAL | Status: AC
  Filled 2022-12-13: qty 2

## 2022-12-13 MED ORDER — PROPOFOL 10 MG/ML IV BOLUS
INTRAVENOUS | Status: DC | PRN
  Administered 2022-12-13: 50 mg via INTRAVENOUS
  Administered 2022-12-13: 100 mg via INTRAVENOUS
  Administered 2022-12-13: 150 mg via INTRAVENOUS
  Administered 2022-12-13: 20 mg via INTRAVENOUS

## 2022-12-13 MED ORDER — LACTATED RINGERS IV SOLN: INTRAVENOUS | Status: DC

## 2022-12-13 MED ORDER — SUGAMMADEX SODIUM 500 MG/5ML IV SOLN
INTRAVENOUS | Status: DC | PRN
  Administered 2022-12-13: 300 mg via INTRAVENOUS

## 2022-12-13 MED ORDER — ENOXAPARIN SODIUM 40 MG/0.4ML IJ SOSY
40.0000 mg | PREFILLED_SYRINGE | INTRAMUSCULAR | Status: DC
Start: 2022-12-14 — End: 2022-12-13

## 2022-12-13 MED ORDER — HYDROMORPHONE HCL 1 MG/ML IJ SOLN
INTRAMUSCULAR | Status: DC | PRN
  Administered 2022-12-13 (×2): .5 mg via INTRAVENOUS

## 2022-12-13 MED ORDER — KETOROLAC TROMETHAMINE 30 MG/ML IJ SOLN
30.0000 mg | Freq: Four times a day (QID) | INTRAMUSCULAR | Status: DC
  Administered 2022-12-13 – 2022-12-14 (×3): 30 mg via INTRAVENOUS
  Filled 2022-12-13 (×3): qty 1

## 2022-12-13 MED ORDER — ONDANSETRON HCL 4 MG/2ML IJ SOLN: 4.0000 mg | Freq: Four times a day (QID) | INTRAMUSCULAR | Status: DC | PRN

## 2022-12-13 MED ORDER — OXYCODONE HCL 5 MG/5ML PO SOLN: 5.0000 mg | Freq: Once | ORAL | Status: AC | PRN

## 2022-12-13 MED ORDER — MOMETASONE FURO-FORMOTEROL FUM 200-5 MCG/ACT IN AERO
2.0000 | INHALATION_SPRAY | Freq: Two times a day (BID) | RESPIRATORY_TRACT | Status: DC
  Administered 2022-12-14 – 2022-12-15 (×3): 2 via RESPIRATORY_TRACT
  Filled 2022-12-13: qty 8.8

## 2022-12-13 MED ORDER — OXYCODONE HCL 5 MG PO TABS
5.0000 mg | ORAL_TABLET | Freq: Once | ORAL | Status: AC | PRN
  Administered 2022-12-13: 5 mg via ORAL

## 2022-12-13 MED ORDER — OXYCODONE HCL 5 MG PO TABS
ORAL_TABLET | ORAL | Status: AC
  Filled 2022-12-13: qty 1

## 2022-12-13 MED ORDER — SODIUM CHLORIDE 0.9 % IV SOLN
2.0000 g | Freq: Three times a day (TID) | INTRAVENOUS | Status: DC
  Filled 2022-12-13: qty 2

## 2022-12-13 MED ORDER — DEXAMETHASONE SODIUM PHOSPHATE 10 MG/ML IJ SOLN
INTRAMUSCULAR | Status: DC | PRN
  Administered 2022-12-13: 10 mg via INTRAVENOUS

## 2022-12-13 MED ORDER — GABAPENTIN 300 MG PO CAPS
ORAL_CAPSULE | ORAL | Status: AC
  Filled 2022-12-13: qty 1

## 2022-12-13 MED ORDER — PHENYLEPHRINE 80 MCG/ML (10ML) SYRINGE FOR IV PUSH (FOR BLOOD PRESSURE SUPPORT)
PREFILLED_SYRINGE | INTRAVENOUS | Status: DC | PRN
  Administered 2022-12-13 (×2): 80 ug via INTRAVENOUS

## 2022-12-13 MED ORDER — HYDROCHLOROTHIAZIDE 12.5 MG PO TABS
12.5000 mg | ORAL_TABLET | Freq: Every day | ORAL | Status: DC
  Administered 2022-12-14 – 2022-12-15 (×2): 12.5 mg via ORAL
  Filled 2022-12-13 (×2): qty 1

## 2022-12-13 MED ORDER — DROPERIDOL 2.5 MG/ML IJ SOLN: 0.6250 mg | Freq: Once | INTRAMUSCULAR | Status: DC | PRN

## 2022-12-13 MED ORDER — ENOXAPARIN SODIUM 60 MG/0.6ML IJ SOSY
0.5000 mg/kg | PREFILLED_SYRINGE | INTRAMUSCULAR | Status: DC
  Administered 2022-12-14 – 2022-12-15 (×2): 60 mg via SUBCUTANEOUS
  Filled 2022-12-13 (×2): qty 0.6

## 2022-12-13 MED ORDER — 0.9 % SODIUM CHLORIDE (POUR BTL) OPTIME
TOPICAL | Status: DC | PRN
  Administered 2022-12-13: 500 mL

## 2022-12-13 MED ORDER — PHENYLEPHRINE HCL-NACL 20-0.9 MG/250ML-% IV SOLN
INTRAVENOUS | Status: AC
  Filled 2022-12-13: qty 250

## 2022-12-13 MED ORDER — BUPIVACAINE-EPINEPHRINE (PF) 0.5% -1:200000 IJ SOLN
INTRAMUSCULAR | Status: DC | PRN
  Administered 2022-12-13: 50 mL

## 2022-12-13 MED ORDER — ROCURONIUM BROMIDE 100 MG/10ML IV SOLN
INTRAVENOUS | Status: DC | PRN
  Administered 2022-12-13 (×4): 20 mg via INTRAVENOUS
  Administered 2022-12-13: 30 mg via INTRAVENOUS
  Administered 2022-12-13: 20 mg via INTRAVENOUS
  Administered 2022-12-13: 60 mg via INTRAVENOUS

## 2022-12-13 MED ORDER — BUPIVACAINE-EPINEPHRINE (PF) 0.5% -1:200000 IJ SOLN
INTRAMUSCULAR | Status: AC
  Filled 2022-12-13: qty 30

## 2022-12-13 MED ORDER — CHLORHEXIDINE GLUCONATE 0.12 % MT SOLN
OROMUCOSAL | Status: AC
  Filled 2022-12-13: qty 15

## 2022-12-13 MED ORDER — GLYCOPYRROLATE 0.2 MG/ML IJ SOLN
INTRAMUSCULAR | Status: DC | PRN
  Administered 2022-12-13: .2 mg via INTRAVENOUS

## 2022-12-13 MED ORDER — MIDAZOLAM HCL 2 MG/2ML IJ SOLN
INTRAMUSCULAR | Status: AC
  Filled 2022-12-13: qty 2

## 2022-12-13 MED ORDER — PROPOFOL 10 MG/ML IV BOLUS
INTRAVENOUS | Status: AC
  Filled 2022-12-13: qty 40

## 2022-12-13 MED ORDER — HYDROMORPHONE HCL 1 MG/ML IJ SOLN
0.5000 mg | INTRAMUSCULAR | Status: DC | PRN
  Administered 2022-12-13 – 2022-12-14 (×3): 0.5 mg via INTRAVENOUS
  Filled 2022-12-13 (×3): qty 0.5

## 2022-12-13 MED ORDER — DEXMEDETOMIDINE HCL IN NACL 200 MCG/50ML IV SOLN
INTRAVENOUS | Status: DC | PRN
  Administered 2022-12-13 (×6): 4 ug via INTRAVENOUS

## 2022-12-13 MED ORDER — PANTOPRAZOLE SODIUM 40 MG IV SOLR
40.0000 mg | Freq: Every day | INTRAVENOUS | Status: DC
  Administered 2022-12-13: 40 mg via INTRAVENOUS
  Filled 2022-12-13: qty 10

## 2022-12-13 MED ORDER — ONDANSETRON 4 MG PO TBDP: 4.0000 mg | ORAL_TABLET | Freq: Four times a day (QID) | ORAL | Status: DC | PRN

## 2022-12-13 MED ORDER — FENTANYL CITRATE (PF) 100 MCG/2ML IJ SOLN
25.0000 ug | INTRAMUSCULAR | Status: DC | PRN
  Administered 2022-12-13 (×2): 25 ug via INTRAVENOUS
  Administered 2022-12-13: 50 ug via INTRAVENOUS

## 2022-12-13 MED ORDER — LOSARTAN POTASSIUM-HCTZ 100-12.5 MG PO TABS: 1.0000 | ORAL_TABLET | ORAL | Status: DC

## 2022-12-13 MED ORDER — LOSARTAN POTASSIUM 50 MG PO TABS
100.0000 mg | ORAL_TABLET | Freq: Every day | ORAL | Status: DC
  Administered 2022-12-14 – 2022-12-15 (×2): 100 mg via ORAL
  Filled 2022-12-13 (×2): qty 2

## 2022-12-13 MED ORDER — ALBUTEROL SULFATE (2.5 MG/3ML) 0.083% IN NEBU: 2.5000 mg | INHALATION_SOLUTION | Freq: Four times a day (QID) | RESPIRATORY_TRACT | Status: DC | PRN

## 2022-12-13 MED ORDER — POLYETHYLENE GLYCOL 3350 17 G PO PACK: 17.0000 g | PACK | Freq: Every day | ORAL | Status: DC | PRN

## 2022-12-13 MED ORDER — SUCCINYLCHOLINE CHLORIDE 200 MG/10ML IV SOSY
PREFILLED_SYRINGE | INTRAVENOUS | Status: DC | PRN
  Administered 2022-12-13: 100 mg via INTRAVENOUS

## 2022-12-13 MED ORDER — SODIUM CHLORIDE 0.9 % IV SOLN
2.0000 g | Freq: Two times a day (BID) | INTRAVENOUS | Status: AC
  Administered 2022-12-13: 2 g via INTRAVENOUS
  Filled 2022-12-13: qty 2

## 2022-12-13 MED ORDER — KETOROLAC TROMETHAMINE 30 MG/ML IJ SOLN
INTRAMUSCULAR | Status: DC | PRN
  Administered 2022-12-13: 30 mg via INTRAVENOUS

## 2022-12-13 SURGICAL SUPPLY — 102 items
ADH SKN CLS APL DERMABOND .7 (GAUZE/BANDAGES/DRESSINGS) ×2
BAG LAPAROSCOPIC 12 15 PORT 16 (BASKET) IMPLANT
BAG RETRIEVAL 12/15 (BASKET) ×2
BLADE SURG SZ11 CARB STEEL (BLADE) ×2 IMPLANT
CANNULA CAP OBTURATR AIRSEAL 8 (CAP) IMPLANT
CANNULA REDUC XI 12-8 STAPL (CANNULA) ×2
CANNULA REDUCER 12-8 DVNC XI (CANNULA) ×2 IMPLANT
COVER TIP SHEARS 8 DVNC (MISCELLANEOUS) IMPLANT
COVER TIP SHEARS 8MM DA VINCI (MISCELLANEOUS) ×2
DERMABOND ADVANCED .7 DNX12 (GAUZE/BANDAGES/DRESSINGS) IMPLANT
DRAPE ARM DVNC X/XI (DISPOSABLE) ×8 IMPLANT
DRAPE COLUMN DVNC XI (DISPOSABLE) ×2 IMPLANT
DRAPE DA VINCI XI ARM (DISPOSABLE) ×8
DRAPE DA VINCI XI COLUMN (DISPOSABLE) ×2
DRSG OPSITE POSTOP 3X4 (GAUZE/BANDAGES/DRESSINGS) ×6 IMPLANT
DRSG OPSITE POSTOP 4X6 (GAUZE/BANDAGES/DRESSINGS) ×2 IMPLANT
ELECT CAUTERY BLADE TIP 2.5 (TIP) ×2
ELECT EZSTD 165MM 6.5IN (MISCELLANEOUS) ×2
ELECT REM PT RETURN 9FT ADLT (ELECTROSURGICAL) ×2
ELECTRODE CAUTERY BLDE TIP 2.5 (TIP) ×2 IMPLANT
ELECTRODE EZSTD 165MM 6.5IN (MISCELLANEOUS) ×2 IMPLANT
ELECTRODE REM PT RTRN 9FT ADLT (ELECTROSURGICAL) ×2 IMPLANT
FORCEPS BPLR R/ABLATION 8 DVNC (INSTRUMENTS) ×2 IMPLANT
GLOVE SURG SYN 7.0 (GLOVE) ×4 IMPLANT
GLOVE SURG SYN 7.0 PF PI (GLOVE) ×6 IMPLANT
GLOVE SURG SYN 7.5  E (GLOVE) ×4
GLOVE SURG SYN 7.5 E (GLOVE) ×4 IMPLANT
GLOVE SURG SYN 7.5 PF PI (GLOVE) ×6 IMPLANT
GOWN STRL REUS W/ TWL LRG LVL3 (GOWN DISPOSABLE) ×10 IMPLANT
GOWN STRL REUS W/TWL LRG LVL3 (GOWN DISPOSABLE) ×4
GRASPER TIP-UP FEN DVNC XI (INSTRUMENTS) ×2 IMPLANT
GRASPER XI TIP-UP FEN DA VINCI (INSTRUMENTS) ×2
HANDLE YANKAUER SUCT BULB TIP (MISCELLANEOUS) ×2 IMPLANT
IRRIGATION STRYKERFLOW (MISCELLANEOUS) IMPLANT
IRRIGATOR STRYKERFLOW (MISCELLANEOUS) ×2
IV NS 1000ML (IV SOLUTION) ×2
IV NS 1000ML BAXH (IV SOLUTION) IMPLANT
KIT PINK PAD W/HEAD ARE REST (MISCELLANEOUS) ×2
KIT PINK PAD W/HEAD ARM REST (MISCELLANEOUS) ×2 IMPLANT
LABEL OR SOLS (LABEL) ×2 IMPLANT
MANIFOLD NEPTUNE II (INSTRUMENTS) ×2 IMPLANT
NDL DRIVE SUT CUT DVNC (INSTRUMENTS) IMPLANT
NDL HYPO 22X1.5 SAFETY MO (MISCELLANEOUS) ×2 IMPLANT
NDL INSUFFLATION 14GA 120MM (NEEDLE) ×2 IMPLANT
NEEDLE DRIVE SUT CUT DVNC (INSTRUMENTS) ×2 IMPLANT
NEEDLE HYPO 22X1.5 SAFETY MO (MISCELLANEOUS) ×2 IMPLANT
NEEDLE INSUFFLATION 14GA 120MM (NEEDLE) ×2 IMPLANT
NS IRRIG 500ML POUR BTL (IV SOLUTION) ×2 IMPLANT
OBTURATOR OPTICAL STANDARD 8MM (TROCAR)
OBTURATOR OPTICAL STND 8 DVNC (TROCAR)
OBTURATOR OPTICALSTD 8 DVNC (TROCAR) IMPLANT
PACK COLON CLEAN CLOSURE (MISCELLANEOUS) ×2 IMPLANT
PACK LAP CHOLECYSTECTOMY (MISCELLANEOUS) ×2 IMPLANT
RELOAD STAPLE 45 3.5 BLU DVNC (STAPLE) IMPLANT
RELOAD STAPLE 60 3.5 BLU DVNC (STAPLE) IMPLANT
RELOAD STAPLER 3.5X45 BLU DVNC (STAPLE) ×6 IMPLANT
RELOAD STAPLER 3.5X60 BLU DVNC (STAPLE) IMPLANT
RETRACTOR WOUND ALXS 18CM SML (MISCELLANEOUS) IMPLANT
RTRCTR WOUND ALEXIS O 18CM SML (MISCELLANEOUS)
SCISSORS MNPLR CVD DVNC XI (INSTRUMENTS) ×2 IMPLANT
SCISSORS XI MNPLR CVD DVNC (INSTRUMENTS) ×2
SEAL UNIV 5-12 XI (MISCELLANEOUS) ×8 IMPLANT
SEAL XI UNIVERSAL 5-12 (MISCELLANEOUS) ×8
SEALER VESSEL DA VINCI XI (MISCELLANEOUS) ×2
SEALER VESSEL EXT DVNC XI (MISCELLANEOUS) ×2 IMPLANT
SET TUBE FILTERED XL AIRSEAL (SET/KITS/TRAYS/PACK) IMPLANT
SOL ELECTROSURG ANTI STICK (MISCELLANEOUS) ×2
SOLUTION ELECTROSURG ANTI STCK (MISCELLANEOUS) ×2 IMPLANT
SPIKE FLUID TRANSFER (MISCELLANEOUS) ×2 IMPLANT
SPONGE T-LAP 18X18 ~~LOC~~+RFID (SPONGE) ×2 IMPLANT
SPONGE T-LAP 4X18 ~~LOC~~+RFID (SPONGE) IMPLANT
STAPLER 45 DA VINCI SURE FORM (STAPLE) ×2
STAPLER 45 SUREFORM DVNC (STAPLE) IMPLANT
STAPLER 60 DA VINCI SURE FORM (STAPLE)
STAPLER 60 SUREFORM DVNC (STAPLE) IMPLANT
STAPLER RELOAD 3.5X45 BLU DVNC (STAPLE) ×6
STAPLER RELOAD 3.5X45 BLUE (STAPLE) ×6
STAPLER RELOAD 3.5X60 BLU DVNC (STAPLE)
STAPLER RELOAD 3.5X60 BLUE (STAPLE)
STAPLER SKIN PROX 35W (STAPLE) ×2 IMPLANT
SURGILUBE 2OZ TUBE FLIPTOP (MISCELLANEOUS) ×2 IMPLANT
SUT MNCRL 4-0 (SUTURE) ×2
SUT MNCRL 4-0 27XMFL (SUTURE) ×2
SUT PDS AB 1 CT1 36 (SUTURE) ×2 IMPLANT
SUT SILK 3 0 SH 30 (SUTURE) ×2 IMPLANT
SUT STRATAFIX PDS 30 CT-1 (SUTURE) IMPLANT
SUT V-LOC 90 ABS 3-0 VLT  V-20 (SUTURE) ×2
SUT V-LOC 90 ABS 3-0 VLT V-20 (SUTURE) ×2 IMPLANT
SUT VIC AB 2-0 SH 27 (SUTURE) ×2
SUT VIC AB 2-0 SH 27XBRD (SUTURE) ×2 IMPLANT
SUT VIC AB 3-0 SH 27 (SUTURE) ×2
SUT VIC AB 3-0 SH 27X BRD (SUTURE) ×2 IMPLANT
SUT VICRYL 0 AB UR-6 (SUTURE) IMPLANT
SUTURE MNCRL 4-0 27XMF (SUTURE) IMPLANT
SYS KII FIOS ACCESS ABD 5X100 (TROCAR) ×2
SYS TROCAR 1.5-3 SLV ABD GEL (ENDOMECHANICALS)
SYSTEM KII FIOS ACES ABD 5X100 (TROCAR) ×2 IMPLANT
SYSTEM TROCR 1.5-3 SLV ABD GEL (ENDOMECHANICALS) IMPLANT
TRAP FLUID SMOKE EVACUATOR (MISCELLANEOUS) ×2 IMPLANT
TRAY FOLEY SLVR 16FR LF STAT (SET/KITS/TRAYS/PACK) ×2 IMPLANT
TUBING EVAC SMOKE HEATED PNEUM (TUBING) ×2 IMPLANT
WATER STERILE IRR 500ML POUR (IV SOLUTION) ×2 IMPLANT

## 2022-12-13 NOTE — Transfer of Care (Signed)
Immediate Anesthesia Transfer of Care Note  Patient: Kelli Morrison  Procedure(s) Performed: XI ROBOTIC ASSISTED SMALL BOWEL RESECTION HERNIA REPAIR UMBILICAL ADULT HERNIA REPAIR INCISIONAL  Patient Location: PACU  Anesthesia Type:General  Level of Consciousness: awake  Airway & Oxygen Therapy: Patient Spontanous Breathing and Patient connected to face mask  Post-op Assessment: Report given to RN and Post -op Vital signs reviewed and stable  Post vital signs: Reviewed  Last Vitals:  Vitals Value Taken Time  BP 98/66   Temp 4F   Pulse 102 12/13/22 1121  Resp 16 12/13/22 1121  SpO2 99 % 12/13/22 1121  Vitals shown include unvalidated device data.  Last Pain:  Vitals:   12/13/22 0633  TempSrc: Temporal  PainSc: 0-No pain         Complications: No notable events documented.

## 2022-12-13 NOTE — Progress Notes (Signed)
PHARMACIST - PHYSICIAN COMMUNICATION  CONCERNING:  Enoxaparin (Lovenox) for DVT Prophylaxis    RECOMMENDATION: Patient was prescribed enoxaprin 40mg  q24 hours for VTE prophylaxis.   Filed Weights   12/13/22 0633  Weight: 119.2 kg (262 lb 12.6 oz)    Body mass index is 40.55 kg/m.  Estimated Creatinine Clearance: 129.8 mL/min (by C-G formula based on SCr of 0.69 mg/dL).   Based on [facility] policy patient is candidate for enoxaparin 0.5mg /kg TBW SQ every 24 hours based on BMI being >30.  DESCRIPTION: Pharmacy has adjusted enoxaparin dose per [facility] policy.  Patient is now receiving enoxaparin 60 mg every 24 hours    Manfred Shirts, PharmD Clinical Pharmacist  12/13/2022 1:16 PM

## 2022-12-13 NOTE — Op Note (Addendum)
Procedure Date:  12/13/2022  Pre-operative Diagnosis:  Meckel's diverticulum  Post-operative Diagnosis:  Meckel's diverticulum, incisional lower ventral hernia, umbilical hernia.  Procedure:  Robotic assisted small bowel resection, primary repair of incisional hernia and primary repair of umbilical hernia, total combined defect length 12 cm.  Surgeon:  Howie Ill, MD  Anesthesia:  General endotracheal  Estimated Blood Loss:  20 ml  Specimens:  Meckel's diverticulum  Complications:  None  Indications for Procedure:  This is a 37 y.o. female who presents with an incidental finding of Meckel's diverticulum during a prior hysterectomy.  She has recovered from her last surgery and presents now for small bowel resection.  The benefits, complications, treatment options, and expected outcomes were discussed with the patient. The risks of bleeding, infection, bowel injury, and need for further procedures were all discussed with the patient and she was willing to proceed.  Description of Procedure: The patient was correctly identified in the preoperative area and brought into the operating room.  The patient was placed supine with VTE prophylaxis in place.  Appropriate time-outs were performed.  Anesthesia was induced and the patient was intubated.  Foley catheter was placed.  Appropriate antibiotics were infused.  The abdomen was prepped and draped in a sterile fashion.  The planned port sites were marked.  An epigastric incision was made and cautery was used to dissect down the subcutaneous tissue to the fascia.  Cutdown technique was used and a 12 mm port was inserted.  Pneumoperitoneum was obtained with appropriate pressures.  Then, an 8 mm port was placed in the right upper quadrant, and 8 mm ports in the left upper quadrant under direct visualization.  The DaVinci platform was then docked, camera targeted, and instruments placed under direct visualization.    Upon initial inspection, the  patient was found to have an incisional hernia forming at the site of her hysterectomy in the low abdomen, with the distal end of the diverticulum scarred onto the hernia site.  She was also noted to have a small umbilical hernia.  The diverticulum was dissected off the anterior abdominal wall using combination of sharp and cautery dissection.  Then, Vessel sealer was used to create windows in the mesentery at proximal and distal resection points, just a few cm away from the base of the diverticulum.  Vessel sealer was used to take down the mesentery between these two points.  2.5 mg of ICG was injected followed by saline flush, and FireFly was used to visualize the blood flow towards both proximal and distal transection points.  A 45 mm blue load stapler was fired at both transection points without difficulty.  The specimen was then moved to the right abdomen.  Then the two ends of the bowel were lined up in isoperistaltic fashion.  A 3-0 silk retention suture was placed and enterotomies were created with cautery scissors.  Another 45 mm blue load stapler was fired to create our anastomosis, and a 3-0 V-lock suture was used to close the common channel in two layers.  Following completion, a 0 stratafix suture was used to close the incisional hernia and the umbilical hernia in one running fashion.  The total defect length combined was 12 cm.  Please note that these hernias were not in the same location as the port sites.  Given that we were not in a clean case, no mesh was used and the hernias were only repaired primarily.  Then, the specimen was placed in a large endocatch bag  via the 12 mm port site.  At that point, pneumoperitoneum was released and ports were removed.  The 12 mm port site was extended for extraction. The specimen bag was retrieved without difficulty.  The fascia was then closed with interrupted 0 vicryl sutures.  50 ml of Exparel mixed with 0.5% bupivacaine with epi was infiltrated onto the  lower midline and umbilical areas where the hernias were repaired and onto the incisions.  The midline incision was closed in two layers using 3-0 Vicryl and 4-0 Monocryl, and the other port sites were closed with 4-0 Monocryl.  The wounds were cleaned and sealed with DermaBond.  The patient was emerged from anesthesia and extubated and brought to the recovery room for further management.   The patient tolerated the procedure well and all counts were correct at the end of the case.    Howie Ill, MD

## 2022-12-13 NOTE — TOC Initial Note (Signed)
Transition of Care [facility]) - Initial/Assessment Note    Patient Details  Name: Kelli Morrison MRN: [id number] Date of Birth: [birth year]  Transition of Care [facility]) CM/SW Contact:    Chapman Fitch, RN Phone Number: 12/13/2022, 4:14 PM  Clinical Narrative:                   Transition of Care (TOC) Screening Note   Patient Details  Name: Kelli Morrison Date of Birth: Feb 03, 1986   Transition of Care [facility]) CM/SW Contact:    Chapman Fitch, RN Phone Number: 12/13/2022, 4:14 PM    Transition of Care Department [facility]) has reviewed patient and no TOC needs have been identified at this time. We will continue to monitor patient advancement through interdisciplinary progression rounds. If new patient transition needs arise, please place a TOC consult.         Patient Goals and CMS Choice            Expected Discharge Plan and Services                                              Prior Living Arrangements/Services                       Activities of Daily Living Home Assistive Devices/Equipment: None ADL Screening (condition at time of admission) Patient's cognitive ability adequate to safely complete daily activities?: Yes Is the patient deaf or have difficulty hearing?: No Does the patient have difficulty seeing, even when wearing glasses/contacts?: No Does the patient have difficulty concentrating, remembering, or making decisions?: No Patient able to express need for assistance with ADLs?: Yes Does the patient have difficulty dressing or bathing?: Yes Independently performs ADLs?: Yes (appropriate for developmental age) Does the patient have difficulty walking or climbing stairs?: No Weakness of Legs: None Weakness of Arms/Hands: None  Permission Sought/Granted                  Emotional Assessment              Admission diagnosis:  Meckel's diverticulum [Q43.0] Patient Active Problem List   Diagnosis Date Noted    Meckel's diverticulum 12/13/2022   Menorrhagia 08/09/2022   PCP:  Ardyth Gal, MD Pharmacy:   [facility] [phone] [facility] [place] - [address] AT [facility] [address] [facility] [address] Phone: [phone] Fax: [phone]     Social Determinants of Health (SDOH) Social History: SDOH Screenings   Food Insecurity: No Food Insecurity (12/13/2022)  Housing: Low Risk  (12/13/2022)  Transportation Needs: No Transportation Needs (12/13/2022)  Utilities: Not At Risk (12/13/2022)  Tobacco Use: High Risk (12/13/2022)   SDOH Interventions:     Readmission Risk Interventions     No data to display

## 2022-12-13 NOTE — Anesthesia Preprocedure Evaluation (Signed)
Anesthesia Evaluation  Patient identified by MRN, date of birth, ID band Patient awake    Reviewed: Allergy & Precautions, NPO status , Patient's Chart, lab work & pertinent test results  History of Anesthesia Complications Negative for: history of anesthetic complications  Airway Mallampati: II  TM Distance: >3 FB Neck ROM: Full    Dental  (+) Missing, Dental Advidsory Given, Poor Dentition   Pulmonary neg shortness of breath, asthma , Current Smoker and Patient abstained from smoking.   Pulmonary exam normal breath sounds clear to auscultation       Cardiovascular hypertension, (-) angina (-) Past MI and (-) CABG negative cardio ROS Normal cardiovascular exam Rhythm:Regular Rate:Normal     Neuro/Psych  Headaches negative neurological ROS  negative psych ROS   GI/Hepatic negative GI ROS, Neg liver ROS,GERD  ,,  Endo/Other  negative endocrine ROS  Class 3 obesity  Renal/GU negative Renal ROS     Musculoskeletal   Abdominal   Peds  Hematology negative hematology ROS (+) Blood dyscrasia, anemia   Anesthesia Other Findings Past Medical History: No date: Anemia No date: Asthma No date: Family history of adverse reaction to anesthesia     Comment:  dad n/v No date: GERD (gastroesophageal reflux disease) No date: Headache     Comment:  migraines No date: Hypertension No date: Marijuana use No date: Meckel's diverticulum No date: Tobacco dependence  Past Surgical History: No date: CESAREAN SECTION 08/09/2022: HYSTERECTOMY ABDOMINAL WITH SALPINGECTOMY; Bilateral     Comment:  Procedure: HYSTERECTOMY ABDOMINAL WITH SALPINGECTOMY;                Surgeon: Schermerhorn, Ihor Austin, MD;  Location: [facility] ORS;              Service: Gynecology;  Laterality: Bilateral; No date: WISDOM TOOTH EXTRACTION  BMI    Body Mass Index: 40.55 kg/m      Reproductive/Obstetrics negative OB ROS                              Anesthesia Physical Anesthesia Plan  ASA: 3  Anesthesia Plan: General ETT   Post-op Pain Management: Dilaudid IV   Induction: Intravenous  PONV Risk Score and Plan: 2 and Ondansetron, Dexamethasone and Treatment may vary due to age or medical condition  Airway Management Planned: Oral ETT  Additional Equipment:   Intra-op Plan:   Post-operative Plan: Extubation in OR  Informed Consent: I have reviewed the patients History and Physical, chart, labs and discussed the procedure including the risks, benefits and alternatives for the proposed anesthesia with the patient or authorized representative who has indicated his/her understanding and acceptance.     Dental Advisory Given  Plan Discussed with: Anesthesiologist, CRNA and Surgeon  Anesthesia Plan Comments: (Patient consented for risks of anesthesia including but not limited to:  - adverse reactions to medications - damage to eyes, teeth, lips or other oral mucosa - nerve damage due to positioning  - sore throat or hoarseness - Damage to heart, brain, nerves, lungs, other parts of body or loss of life  Patient voiced understanding.)        Anesthesia Quick Evaluation

## 2022-12-13 NOTE — Anesthesia Procedure Notes (Signed)
Procedure Name: Intubation Date/Time: 12/13/2022 7:42 AM  Performed by: Merlene Pulling, CRNAPre-anesthesia Checklist: Patient identified, Patient being monitored, Timeout performed, Emergency Drugs available and Suction available Patient Re-evaluated:Patient Re-evaluated prior to induction Oxygen Delivery Method: Circle system utilized Preoxygenation: Pre-oxygenation with 100% oxygen Induction Type: IV induction Ventilation: Mask ventilation without difficulty Laryngoscope Size: Mac and 3 Grade View: Grade I Tube type: Oral Tube size: 7.0 mm Number of attempts: 1 Airway Equipment and Method: Stylet Placement Confirmation: ETT inserted through vocal cords under direct vision, positive ETCO2 and breath sounds checked- equal and bilateral Secured at: 20 cm Tube secured with: Tape Dental Injury: Teeth and Oropharynx as per pre-operative assessment

## 2022-12-13 NOTE — Brief Op Note (Signed)
12/13/2022  11:19 AM  PATIENT:  Kelli Morrison  37 y.o. female  PRE-OPERATIVE DIAGNOSIS:  Meckel's diverticulum  POST-OPERATIVE DIAGNOSIS:  S/P small bowel resection with umbilical and incisional hernia repair  PROCEDURE:  Procedure(s): XI ROBOTIC ASSISTED SMALL BOWEL RESECTION (N/A) HERNIA REPAIR UMBILICAL ADULT HERNIA REPAIR INCISIONAL  SURGEON:  Surgeon(s) and Role:    * Atticus Wedin, MD - Primary  ANESTHESIA:   general  EBL:  20 ml   BLOOD ADMINISTERED:none  DRAINS: none   LOCAL MEDICATIONS USED:  BUPIVICAINE   SPECIMEN:  Source of Specimen:  Meckel's diverticulum  DISPOSITION OF SPECIMEN:  PATHOLOGY  COUNTS:  YES  DICTATION: .Dragon Dictation  PLAN OF CARE: Admit to inpatient   PATIENT DISPOSITION:  PACU - hemodynamically stable.   Delay start of Pharmacological VTE agent (>24hrs) due to surgical blood loss or risk of bleeding: yes

## 2022-12-13 NOTE — Interval H&P Note (Signed)
History and Physical Interval Note:  12/13/2022 7:05 AM  Kelli Morrison  has presented today for surgery, with the diagnosis of Meckel's diverticulum.  The various methods of treatment have been discussed with the patient and family. After consideration of risks, benefits and other options for treatment, the patient has consented to  Procedure(s): XI ROBOTIC ASSISTED SMALL BOWEL RESECTION (N/A) as a surgical intervention.  The patient's history has been reviewed, patient examined, no change in status, stable for surgery.  I have reviewed the patient's chart and labs.  Questions were answered to the patient's satisfaction.     [name]

## 2022-12-13 NOTE — Anesthesia Postprocedure Evaluation (Signed)
Anesthesia Post Note  Patient: Nechuma Faubion Hagans  Procedure(s) Performed: XI ROBOTIC ASSISTED SMALL BOWEL RESECTION HERNIA REPAIR UMBILICAL ADULT HERNIA REPAIR INCISIONAL  Patient location during evaluation: PACU Anesthesia Type: General Level of consciousness: awake and alert Pain management: pain level controlled Vital Signs Assessment: post-procedure vital signs reviewed and stable Respiratory status: spontaneous breathing, nonlabored ventilation, respiratory function stable and patient connected to nasal cannula oxygen Cardiovascular status: blood pressure returned to baseline and stable Postop Assessment: no apparent nausea or vomiting Anesthetic complications: no  No notable events documented.   Last Vitals:  Vitals:   12/13/22 1145 12/13/22 1200  BP: 112/72 119/68  Pulse: 96 97  Resp: 16 14  Temp:    SpO2: 92% 92%    Last Pain:  Vitals:   12/13/22 1145  TempSrc:   PainSc: Asleep                 Stephanie Coup

## 2022-12-14 LAB — MAGNESIUM: Magnesium: 2.1 mg/dL (ref 1.7–2.4)

## 2022-12-14 LAB — CBC
HCT: 35.7 % — ABNORMAL LOW (ref 36.0–46.0)
Hemoglobin: 11.5 g/dL — ABNORMAL LOW (ref 12.0–15.0)
MCH: 30.2 pg (ref 26.0–34.0)
MCHC: 32.2 g/dL (ref 30.0–36.0)
MCV: 93.7 fL (ref 80.0–100.0)
Platelets: 320 10*3/uL (ref 150–400)
RBC: 3.81 MIL/uL — ABNORMAL LOW (ref 3.87–5.11)
RDW: 17.3 % — ABNORMAL HIGH (ref 11.5–15.5)
WBC: 12.3 10*3/uL — ABNORMAL HIGH (ref 4.0–10.5)
nRBC: 0 % (ref 0.0–0.2)

## 2022-12-14 LAB — HIV ANTIBODY (ROUTINE TESTING W REFLEX): HIV Screen 4th Generation wRfx: NONREACTIVE

## 2022-12-14 LAB — BASIC METABOLIC PANEL
Anion gap: 8 (ref 5–15)
BUN: 17 mg/dL (ref 6–20)
CO2: 24 mmol/L (ref 22–32)
Calcium: 8 mg/dL — ABNORMAL LOW (ref 8.9–10.3)
Chloride: 101 mmol/L (ref 98–111)
Creatinine, Ser: 0.71 mg/dL (ref 0.44–1.00)
GFR, Estimated: >60 mL/min (ref >60)
Glucose, Bld: 105 mg/dL — ABNORMAL HIGH (ref 70–99)
Potassium: 3.6 mmol/L (ref 3.5–5.1)
Sodium: 133 mmol/L — ABNORMAL LOW (ref 135–145)

## 2022-12-14 LAB — SURGICAL PATHOLOGY

## 2022-12-14 MED ORDER — PREDNISONE 20 MG PO TABS
40.0000 mg | ORAL_TABLET | Freq: Every day | ORAL | Status: DC
  Administered 2022-12-14 – 2022-12-15 (×2): 40 mg via ORAL
  Filled 2022-12-14 (×2): qty 2

## 2022-12-14 MED ORDER — PREDNISONE 20 MG PO TABS: 20.0000 mg | ORAL_TABLET | Freq: Every day | ORAL | Status: DC

## 2022-12-14 MED ORDER — KETOROLAC TROMETHAMINE 30 MG/ML IJ SOLN: 30.0000 mg | Freq: Four times a day (QID) | INTRAMUSCULAR | Status: DC | PRN

## 2022-12-14 MED ORDER — PANTOPRAZOLE SODIUM 40 MG PO TBEC
40.0000 mg | DELAYED_RELEASE_TABLET | Freq: Every day | ORAL | Status: DC
  Administered 2022-12-14: 40 mg via ORAL
  Filled 2022-12-14: qty 1

## 2022-12-14 NOTE — Discharge Instructions (Signed)
In addition to included general post-operative instructions,  Diet: Resume home diet.   Activity: No heavy lifting >20 pounds (children, pets, laundry, garbage) or strenuous activity for 4 weeks, but light activity and walking are encouraged. Do not drive or drink alcohol if taking narcotic pain medications or having pain that might distract from driving.  Wound care: You may shower/get incision wet with soapy water and pat dry (do not rub incisions), but no baths or submerging incision underwater until follow-up.   Medications: Resume all home medications. For mild to moderate pain: acetaminophen (Tylenol) or ibuprofen/naproxen (if no kidney disease). Combining Tylenol with alcohol can substantially increase your risk of causing liver disease. Narcotic pain medications, if prescribed, can be used for severe pain, though may cause nausea, constipation, and drowsiness. Do not combine Tylenol and Percocet (or similar) within a 6 hour period as Percocet (and similar) contain(s) Tylenol. If you do not need the narcotic pain medication, you do not need to fill the prescription.  Call office [phone] / [phone]) at any time if any questions, worsening pain, fevers/chills, bleeding, drainage from incision site, or other concerns. Diagnostic Laparoscopy, Care After The following information offers guidance on how to care for yourself after your procedure. Your health care provider may also give you more specific instructions. If you have problems or questions, contact your health care provider. What can I expect after the procedure? After the procedure, it is common to have: Mild discomfort in the abdomen. Sore throat. Women who have laparoscopy with a pelvic examination may have mild cramping and fluid coming from the vagina for a few days after the procedure. Follow these instructions at home: Medicines Take over-the-counter and prescription medicines only as told by your health care  provider. If you were prescribed an antibiotic medicine, take it as told by your health care provider. Do not stop taking the antibiotic even if you start to feel better. Ask your health care provider if the medicine prescribed to you: Requires you to avoid driving or using machinery. Can cause constipation. You may need to take these actions to prevent or treat constipation: Drink enough fluid to keep your urine pale yellow. Take over-the-counter or prescription medicines. Eat foods that are high in fiber, such as beans, whole grains, and fresh fruits and vegetables. Limit foods that are high in fat and processed sugars, such as fried or sweet foods. Incision care  Follow instructions from your health care provider about how to take care of your incisions. Make sure you: Wash your hands with soap and water for at least 20 seconds before and after you change your bandage (dressing). If soap and water are not available, use hand sanitizer. Change your dressing as told by your health care provider. Leave stitches (sutures), skin glue, or surgical tape in place. These skin closures may need to stay in place for 2 weeks or longer. If surgical tape edges start to loosen and curl up, you may trim the loose edges. Do not remove the surgical tape completely unless your health care provider tells you to do that. Check your incision areas every day for signs of infection. Check for: Redness, swelling, or pain. Fluid or blood. Warmth. Pus or a bad smell. Activity Return to your normal activities as told by your health care provider. Ask your health care provider what activities are safe for you. Do not lift anything that is heavier than 10 lb (4.5 kg), or the limit that you are told, until your health care  provider says that it is safe. Avoid sitting for a long time without moving. Get up to take short walks every 1-2 hours. This is important to improve blood flow and breathing. Ask for help if you feel  weak or unsteady. General instructions Do not use any products that contain nicotine or tobacco. These products include cigarettes, chewing tobacco, and vaping devices, such as e-cigarettes. If you need help quitting, ask your health care provider. If you were given a sedative during the procedure, it can affect you for several hours. Do not drive or operate machinery until your health care provider says that it is safe. Do not take baths, swim, or use a hot tub until your health care provider approves. Ask your health care provider if you may take showers. You may only be allowed to take sponge baths. Keep all follow-up visits. This is important. Contact a health care provider if: You develop shoulder pain. You feel light-headed or faint. You are unable to pass gas or have a bowel movement. You feel nauseous or you vomit. You develop a rash. You have any of these signs of infection: Redness, swelling, or pain around an incision. Fluid or blood coming from an incision. Warmth coming from an incision. Pus or a bad smell coming from an incision. A fever or chills. Get help right away if: You have severe pain. You have vomiting that does not go away. You have heavy bleeding from the vagina. Any incision opens up. You have trouble breathing. You have chest pain. These symptoms may represent a serious problem that is an emergency. Do not wait to see if the symptoms will go away. Get medical help right away. Call your local emergency services (911 in the [place] Do not drive yourself to the hospital. Summary After the procedure, it is common to have mild discomfort in the abdomen and a sore throat. Check your incision areas every day for signs of infection. Return to your normal activities as told by your health care provider. Ask your health care provider what activities are safe for you. This information is not intended to replace advice given to you by your health care provider. Make sure  you discuss any questions you have with your health care provider.

## 2022-12-14 NOTE — Progress Notes (Signed)
PHARMACIST - PHYSICIAN COMMUNICATION  CONCERNING: IV to Oral Route Change Policy  RECOMMENDATION: This patient is receiving pantoprazole by the intravenous route.  Based on criteria approved by the Pharmacy and Therapeutics Committee, the intravenous medication(s) is/are being converted to the equivalent oral dose form(s).  DESCRIPTION: These criteria include: The patient is eating (either orally or via tube) and/or has been taking other orally administered medications for a least 24 hours The patient has no evidence of active gastrointestinal bleeding or impaired GI absorption (gastrectomy, short bowel, patient on TNA or NPO).  If you have questions about this conversion, please contact the Pharmacy Department   Tressie Ellis, [facility] 12/14/2022 8:44 AM

## 2022-12-14 NOTE — Progress Notes (Signed)
[facility] SURGICAL PROGRESS NOTE  Hospital Day(s): 1.   Post op day(s): 1 Day Post-Op.   Interval History:  Patient seen and examined No acute events or new complaints overnight.  Patient reports she is doing well Abdominal soreness No fever, chills, nausea, emesis Mild leukocytosis; 12.3K Hgb to 11.5 Renal function normal with sCr - 0.71; UO - *765 ccs + unmeasured  She is on CLD; tolerating Passing flatus Ambulating   Vital signs in last 24 hours: [min-max] current  Temp:  [97 F (36.1 C)-98.4 F (36.9 C)] 98.4 F (36.9 C) (04/12 0409) Pulse Rate:  [79-103] 79 (04/12 0409) Resp:  [11-18] 16 (04/12 0409) BP: (89-124)/(50-77) 121/68 (04/12 0409) SpO2:  [92 %-98 %] 97 % (04/12 0409)     Height: 5' 7.5" (171.5 cm) Weight: 119.2 kg BMI (Calculated): 40.53   Intake/Output last 2 shifts:  04/11 0701 - 04/12 0700 In: 3940.7 [P.O.:1020; I.V.:2720.7; IV Piggyback:200.1] Out: 765 [Urine:765]   Physical Exam:  Constitutional: alert, cooperative and no distress  Respiratory: breathing non-labored at rest  Cardiovascular: regular rate and sinus rhythm  Gastrointestinal: Soft, incisional soreness, non-distended, no rebound/guarding Integumentary: Laparoscopic incisions are CDI with dermabond, no erythema or drainage   Labs:     Latest Ref Rng & Units 12/14/2022    5:12 AM 12/10/2022   11:20 AM 08/09/2022   12:52 PM  CBC  WBC 4.0 - 10.5 K/uL 12.3  10.4  19.3   Hemoglobin 12.0 - 15.0 g/dL 11.1  55.2  08.0   Hematocrit 36.0 - 46.0 % 35.7  39.6  40.3   Platelets 150 - 400 K/uL 320  346  333       Latest Ref Rng & Units 12/14/2022    5:12 AM 12/10/2022   11:20 AM 08/09/2022   12:52 PM  CMP  Glucose 70 - 99 mg/dL 223  98  361   BUN 6 - 20 mg/dL 17  11  12    Creatinine 0.44 - 1.00 mg/dL 2.24  4.97  5.30   Sodium 135 - 145 mmol/L 133  137  132   Potassium 3.5 - 5.1 mmol/L 3.6  3.7  3.7   Chloride 98 - 111 mmol/L 101  105  103   CO2 22 - 32 mmol/L 24  22  22     Calcium 8.9 - 10.3 mg/dL 8.0  8.7  8.6      Imaging studies: No new pertinent imaging studies   Assessment/Plan:  37 y.o. female 1 Day Post-Op s/p robotic assisted laparoscopic small bowel resection, primary repair of incisional hernia and primary repair of umbilical hernia (12 cm)    - FLD for breakfast; Soft diet for dinner - Wean IVF resuscitation - Monitor abdominal examination; on-going bowel function - Pain control prn; antiemetics prn   - Mobilize      - Discharge Planning: Diet advancing today; Anticipate DC tomorrow (04/13).   All of the above findings and recommendations were discussed with the patient, and the medical team, and all of patient's questions were answered to her expressed satisfaction.  -- Lynden Oxford, PA-C [facility] 12/14/2022, 7:29 AM M-F: 7am - 4pm

## 2022-12-15 MED ORDER — OXYCODONE-ACETAMINOPHEN 5-325 MG PO TABS: 1.0000 | ORAL_TABLET | ORAL | 0 refills | Status: DC | PRN

## 2022-12-15 NOTE — Discharge Summary (Signed)
Patient ID: Kelli Morrison MRN: [id number] DOB/AGE: [birth year] 37 y.o.  Admit date: 12/13/2022 Discharge date: 12/15/2022   Discharge Diagnoses:  Principal Problem:   Meckel's diverticulum Active Problems:   Incisional hernia, without obstruction or gangrene   Umbilical hernia without obstruction and without gangrene   Procedures:Robotic Small bowel resection  Hospital Course:  37 yo admitted after an uneventful laparoscopic small bowel resection.  Patient was kept for two nights.  At The time of discharge the patient was ambulating,  pain was controlled. SHe was having Flatus and tolerating diet. Her vital signs were stable and she was afebrile.   physical exam at discharge showed a pt  in no acute distress.  Awake and alert.  Abdomen: Soft incisions healing well without infection or peritonitis.  Extremities well-perfused and no edema.  Condition of the patient the time of discharge was stable    Disposition: Discharge disposition: 01-Home or Self Care       Discharge Instructions     Call MD for:  difficulty breathing, headache or visual disturbances   Complete by: As directed    Call MD for:  extreme fatigue   Complete by: As directed    Call MD for:  hives   Complete by: As directed    Call MD for:  persistant dizziness or light-headedness   Complete by: As directed    Call MD for:  persistant nausea and vomiting   Complete by: As directed    Call MD for:  redness, tenderness, or signs of infection (pain, swelling, redness, odor or green/yellow discharge around incision site)   Complete by: As directed    Call MD for:  severe uncontrolled pain   Complete by: As directed    Call MD for:  temperature >100.4   Complete by: As directed    Diet - low sodium heart healthy   Complete by: As directed    Discharge instructions   Complete by: As directed    Shower daily starting today   Increase activity slowly   Complete by: As directed    Lifting restrictions    Complete by: As directed    20 lbs x 6 weeks      Allergies as of 12/15/2022   No Known Allergies      Medication List     TAKE these medications    albuterol 108 (90 Base) MCG/ACT inhaler Commonly known as: VENTOLIN HFA Inhale 2 puffs into the lungs every 6 (six) hours as needed.   ALPRAZolam 0.25 MG tablet Commonly known as: XANAX Take 0.25 mg by mouth 2 (two) times daily as needed for anxiety.   budesonide-formoterol 160-4.5 MCG/ACT inhaler Commonly known as: SYMBICORT Inhale 2 puffs into the lungs 2 (two) times daily.   cetirizine 10 MG tablet Commonly known as: ZYRTEC Take 10 mg by mouth at bedtime.   COSENTYX (300 MG DOSE) [place] Inject 300 mg into the muscle once a week.   docusate sodium 100 MG capsule Commonly known as: COLACE Take 1 capsule (100 mg total) by mouth 2 (two) times daily. To keep stools soft What changed:  when to take this reasons to take this additional instructions   esomeprazole 20 MG packet Commonly known as: NEXIUM Take 20 mg by mouth daily before breakfast.   FLUoxetine 20 MG capsule Commonly known as: PROZAC Take 40 mg by mouth every morning.   losartan-hydrochlorothiazide 100-12.5 MG tablet Commonly known as: HYZAAR Take 1 tablet by mouth every morning.   montelukast  10 MG tablet Commonly known as: SINGULAIR Take 10 mg by mouth every morning.   multivitamin tablet Take 1 tablet by mouth daily.   oxyCODONE-acetaminophen 5-325 MG tablet Commonly known as: Percocet Take 1-2 tablets by mouth every 4 (four) hours as needed for severe pain.   predniSONE 10 MG tablet Commonly known as: DELTASONE Take 10 mg by mouth daily. 60 mg x 4 days, 40 mg x 4 days, 20 mg x 4 days   Vitamin D (Ergocalciferol) 1.25 MG (50000 UNIT) Caps capsule Commonly known as: DRISDOL Take 50,000 Units by mouth every 7 (seven) days.        Follow-up Information     Piscoya, Elita Quick, MD. Schedule an appointment as soon as possible for a visit in 2  week(s).   Specialty: General Surgery Why: s/p small bowel resection Contact information: [address] [place] [address] [phone]                  Sterling Big, MD FACS

## 2022-12-17 LAB — GLUCOSE, CAPILLARY
Glucose-Capillary: 113 mg/dL — ABNORMAL HIGH (ref 70–99)
Glucose-Capillary: 114 mg/dL — ABNORMAL HIGH (ref 70–99)

## 2022-12-28 ENCOUNTER — Encounter: Payer: Self-pay | Admitting: Surgery

## 2022-12-28 ENCOUNTER — Ambulatory Visit (INDEPENDENT_AMBULATORY_CARE_PROVIDER_SITE_OTHER): Payer: Medicaid Other | Admitting: Surgery

## 2022-12-28 VITALS — BP 140/80 | HR 95 | Temp 98.0°F | Ht 67.5 in | Wt 262.0 lb

## 2022-12-28 DIAGNOSIS — K429 Umbilical hernia without obstruction or gangrene: Secondary | ICD-10-CM

## 2022-12-28 DIAGNOSIS — Z09 Encounter for follow-up examination after completed treatment for conditions other than malignant neoplasm: Secondary | ICD-10-CM

## 2022-12-28 DIAGNOSIS — K432 Incisional hernia without obstruction or gangrene: Secondary | ICD-10-CM

## 2022-12-28 DIAGNOSIS — Q430 Meckel's diverticulum (displaced) (hypertrophic): Secondary | ICD-10-CM

## 2022-12-28 NOTE — Patient Instructions (Addendum)
Follow-up with our office as needed.  Please call and ask to speak with a nurse if you develop questions or concerns.   GENERAL POST-OPERATIVE PATIENT INSTRUCTIONS   WOUND CARE INSTRUCTIONS: Try to keep the wound dry and avoid ointments on the wound unless directed to do so.  If the wound becomes bright red and painful or starts to drain infected material that is not clear, please contact your physician immediately.  If the wound is mildly pink and has a thick firm ridge underneath it, this is normal, and is referred to as a healing ridge.  This will resolve over the next 4-6 weeks.  BATHING: You may shower if you have been informed of this by your surgeon. However, Please do not submerge in a tub, hot tub, or pool until incisions are completely sealed or have been told by your surgeon that you may do so.  DIET:  You may eat any foods that you can tolerate.  It is a good idea to eat a high fiber diet and take in plenty of fluids to prevent constipation.  If you do become constipated you may want to take a mild laxative or take ducolax tablets on a daily basis until your bowel habits are regular.  Constipation can be very uncomfortable, along with straining, after recent surgery.  ACTIVITY: You are encouraged to walk and engage in light activity for the next two weeks.  You should not lift more than 20 pounds for 4 weeks total after surgery as it could put you at increased risk for complications. Twenty pounds is roughly equivalent to a plastic bag of groceries. At that time- Listen to your body when lifting, if you have pain when lifting, stop and then try again in a few days. Soreness after doing exercises or activities of daily living is normal as you get back in to your normal routine.  MEDICATIONS:  Try to take narcotic medications and anti-inflammatory medications, such as tylenol, ibuprofen, naprosyn, etc., with food.  This will minimize stomach upset from the medication.  Should you develop  nausea and vomiting from the pain medication, or develop a rash, please discontinue the medication and contact your physician.  You should not drive, make important decisions, or operate machinery when taking narcotic pain medication.  SUNBLOCK Use sun block to incision area over the next year if this area will be exposed to sun. This helps decrease scarring and will allow you avoid a permanent darkened area over your incision.  QUESTIONS:  Please feel free to call our office if you have any questions, and we will be glad to assist you. [phone]

## 2022-12-28 NOTE — Progress Notes (Signed)
12/28/2022  HPI: Kelli Morrison is a 37 y.o. female s/p robotic assisted small bowel resection with primary repair of an incisional hernia and an umbilical hernia on 12/13/22.  Patient presents today for follow-up.  She reports that after some initial discomfort her pain has been getting better.  She is tolerating a diet, having normal bowel function, without any nausea or vomiting.  Her pain has been improving.  She does report having bruising around the incisions and from the site of Lovenox injections.  Vital signs: BP (!) 140/80   Pulse 95   Temp 98 F (36.7 C)   Ht 5' 7.5" (1.715 m)   Wt 262 lb (118.8 kg)   LMP 07/10/2022 (Exact Date)   SpO2 99%   BMI 40.43 kg/m    Physical Exam: Constitutional: No acute distress Abdomen: Soft, nondistended, appropriately sore to palpation.  Incisions are healing well and are clean, dry, intact with some surrounding ecchymosis around the incisions and also in the lower abdomen at site of Lovenox injections.  No evidence of recurrent hernias at this point.  Assessment/Plan: This is a 37 y.o. female s/p robotic assisted small bowel resection and primary repair of incisional hernia and umbilical hernia.  - Discussed with patient that currently she is healing appropriately and is not uncommon to have some soreness or bruising after surgery.  This will keep improving on its own. - Discussed pathology results with the patient showing Meckel's diverticulum with benign enteric tissue within the diverticulum. - Reminded patient of activity restrictions. - Follow-up as needed.   Howie Ill, MD [facility]

## 2023-01-09 ENCOUNTER — Encounter: Payer: Self-pay | Admitting: Surgery

## 2023-01-09 ENCOUNTER — Ambulatory Visit (INDEPENDENT_AMBULATORY_CARE_PROVIDER_SITE_OTHER): Payer: Medicaid Other | Admitting: Surgery

## 2023-01-09 VITALS — BP 130/87 | HR 109 | Temp 98.0°F | Ht 67.5 in | Wt 266.0 lb

## 2023-01-09 DIAGNOSIS — Z09 Encounter for follow-up examination after completed treatment for conditions other than malignant neoplasm: Secondary | ICD-10-CM

## 2023-01-09 DIAGNOSIS — K429 Umbilical hernia without obstruction or gangrene: Secondary | ICD-10-CM

## 2023-01-09 DIAGNOSIS — Q430 Meckel's diverticulum (displaced) (hypertrophic): Secondary | ICD-10-CM

## 2023-01-09 DIAGNOSIS — K432 Incisional hernia without obstruction or gangrene: Secondary | ICD-10-CM

## 2023-01-09 DIAGNOSIS — T8131XA Disruption of external operation (surgical) wound, not elsewhere classified, initial encounter: Secondary | ICD-10-CM

## 2023-01-09 MED ORDER — CEPHALEXIN 500 MG PO CAPS: 500.0000 mg | ORAL_CAPSULE | Freq: Two times a day (BID) | ORAL | 0 refills | Status: AC

## 2023-01-09 NOTE — Progress Notes (Signed)
01/09/2023  HPI: Gevena Corrales Morrison is a 37 y.o. 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  She presents today for further follow up.  Reports that recently she has noticed that the wound in the midline has opened and started draining fluid that was yellow in nature.  Denies any worsening pain but has noticed some irritation of the skin surrounding it.  She has noticed something very similar but much milder in the right side incision as well.  Denies any fevers or chills.  Vital signs: BP 130/87   Pulse (!) 109   Temp 98 F (36.7 C) (Oral)   Ht 5' 7.5" (1.715 m)   Wt 266 lb (120.7 kg)   LMP 07/10/2022 (Exact Date)   SpO2 96%   BMI 41.05 kg/m    Physical Exam: Constitutional:  No acute distress Abdomen: Soft, nondistended, with no tenderness to palpation.  The midline incision which was the extraction site has mild erythema at the skin edges but overall the wound has dehisced for about 5 mm separation.  There is some exposed fibrinous and subcutaneous tissue underneath but no true depth to the wound.  The right-sided port incision has mild dehiscence of 2 mm also some mild skin irritation at the edges.  The left mid clavicular incision also has a 1 mm dehiscence with some skin irritation.  The left most lateral incision is healing well without any irritation or dehiscence.  Dry gauze dressing applied to all 3 wounds.  Assessment/Plan: This is a 37 y.o. female s/p robotic assisted small bowel resection with repair of both incisional and umbilical hernias.  - Discussed with the patient that she has a wound dehiscence of 3 of the 4 port sites.  There may be some mild degree of infection at these incisions given the irritation at the edges of the skin.  As a precaution, we will give her a course of Keflex.  Instructed her on doing dry gauze dressing changes to these wounds to keep the area clean  and dry. - Follow-up in 10 days to reassess her progress.   Howie Ill, MD [facility]

## 2023-01-09 NOTE — Patient Instructions (Signed)
Please pick up your prescription at your pharmacy.   If you have any concerns or questions, please feel free to call our office.  Minimally Invasive Small Bowel Resection, Care After The following information offers guidance on how to care for yourself after your procedure. Your health care provider may also give you more specific instructions. If you have problems or questions, contact your health care provider. What can I expect after the procedure? After the procedure, it is common to have: Pain or swelling in your abdomen. Tiredness (fatigue). Diarrhea. Nausea. Follow these instructions at home: Medicines Take over-the-counter and prescription medicines only as told by your health care provider. If you were prescribed antibiotics, use them as told by your health care provider. Do not stop using the antibiotic even if you start to feel better. Ask your health care provider if the medicine prescribed to you requires you to avoid driving or using machinery. Eating and drinking Follow instructions from your health care provider about what you may eat and drink. You may be instructed to: Work with a dietitian to find the best eating plan for you. Have fluids or eat a soft diet while your bowel heals. Eat foods that are easy to digest. Take supplements to get enough vitamins and minerals. Drink enough fluid to keep your urine pale yellow. Incision care  Follow instructions from your health care provider about how to take care of your incision areas. Make sure you: Wash your hands with soap and water for at least 20 seconds before and after you change your bandage (dressing). If soap and water are not available, use hand sanitizer. Change your dressing as told by your health care provider. Leave stitches (sutures), skin glue, or adhesive strips in place. These skin closures may need to stay in place for 2 weeks or longer. If adhesive strip edges start to loosen and curl up, you may trim the  loose edges. Do not remove adhesive strips completely unless your health care provider tells you to do that. Check your incision areas every day for signs of infection. Check for: Redness, swelling, or more pain. Fluid or blood. Warmth. Pus or a bad smell. Activity Rest as told by your health care provider. Do not sit for a long time without moving. Get up to take short walks every 1-2 hours. This will improve blood flow and breathing. Ask for help if you feel weak or unsteady. You may have to avoid lifting. Ask your health care provider how much you can safely lift. Return to your normal activities as told by your health care provider. Ask your health care provider what activities are safe for you. General instructions Do not use any products that contain nicotine or tobacco. These products include cigarettes, chewing tobacco, and vaping devices, such as e-cigarettes. These can delay healing after surgery. If you need help quitting, ask your health care provider. Do not take baths, swim, or use a hot tub until your health care provider approves. Ask your health care provider if you may take showers. You may only be allowed to take sponge baths. Wear compression stockings as told by your health care provider. These stockings help to prevent blood clots and reduce swelling in your legs. Do deep breathing and coughing exercises as told by your health care provider. If it hurts to cough, hold a pillow against your abdomen as you cough. If you were given a device to help you with your breathing (incentive spirometer), use it as told by your  health care provider. Keep all follow-up visits. Your health care provider will monitor your healing and digestion and watch for problems. Contact a health care provider if: You have pain that does not get better with medicine. You have stomach discomfort, such as: You do not feel like eating. You feel nauseous or you vomit. You have diarrhea that gets  worse. You have swelling of the abdomen that gets worse. You have a fever or chills. Your incision breaks open or you have any signs of infection. Your legs or arms become painful, red, or swollen. You have bleeding from the rectum. You cannot have a bowel movement or pass gas. Get help right away if: You have chest pain. You have trouble breathing. These symptoms may be an emergency. Get help right away. Call 911. Do not wait to see if the symptoms will go away. Do not drive yourself to the hospital. This information is not intended to replace advice given to you by your health care provider. Make sure you discuss any questions you have with your health care provider. Document Revised: 01/19/2022 Document Reviewed: 01/19/2022 Elsevier Patient Education  2023 [employer].

## 2023-01-18 ENCOUNTER — Encounter: Payer: Self-pay | Admitting: Surgery

## 2023-01-18 ENCOUNTER — Ambulatory Visit (INDEPENDENT_AMBULATORY_CARE_PROVIDER_SITE_OTHER): Payer: Medicaid Other | Admitting: Surgery

## 2023-01-18 VITALS — BP 119/84 | HR 95 | Temp 98.0°F | Ht 67.5 in | Wt 261.0 lb

## 2023-01-18 DIAGNOSIS — K429 Umbilical hernia without obstruction or gangrene: Secondary | ICD-10-CM

## 2023-01-18 DIAGNOSIS — Q430 Meckel's diverticulum (displaced) (hypertrophic): Secondary | ICD-10-CM

## 2023-01-18 DIAGNOSIS — K432 Incisional hernia without obstruction or gangrene: Secondary | ICD-10-CM

## 2023-01-18 DIAGNOSIS — Z09 Encounter for follow-up examination after completed treatment for conditions other than malignant neoplasm: Secondary | ICD-10-CM

## 2023-01-18 DIAGNOSIS — T8131XA Disruption of external operation (surgical) wound, not elsewhere classified, initial encounter: Secondary | ICD-10-CM

## 2023-01-18 DIAGNOSIS — T8131XD Disruption of external operation (surgical) wound, not elsewhere classified, subsequent encounter: Secondary | ICD-10-CM

## 2023-01-18 NOTE — Patient Instructions (Addendum)
May use Neosporin daily on wounds and keep a dressing over them until they heal fully.  Follow-up with our office as needed.  Please call and ask to speak with a nurse if you develop questions or concerns.   GENERAL POST-OPERATIVE PATIENT INSTRUCTIONS   WOUND CARE INSTRUCTIONS:  Try to keep the wound dry and avoid ointments on the wound unless directed to do so.  If the wound becomes bright red and painful or starts to drain infected material that is not clear, please contact your physician immediately.  If the wound is mildly pink and has a thick firm ridge underneath it, this is normal, and is referred to as a healing ridge.  This will resolve over the next 4-6 weeks.  BATHING: You may shower if you have been informed of this by your surgeon. However, Please do not submerge in a tub, hot tub, or pool until incisions are completely sealed or have been told by your surgeon that you may do so.  DIET:  You may eat any foods that you can tolerate.  It is a good idea to eat a high fiber diet and take in plenty of fluids to prevent constipation.  If you do become constipated you may want to take a mild laxative or take ducolax tablets on a daily basis until your bowel habits are regular.  Constipation can be very uncomfortable, along with straining, after recent surgery.  ACTIVITY: You are encouraged to walk and engage in light activity for the next two weeks.  You should not lift more than 20 pounds for 6 weeks total after surgery as it could put you at increased risk for complications.  Twenty pounds is roughly equivalent to a plastic bag of groceries. At that time- Listen to your body when lifting, if you have pain when lifting, stop and then try again in a few days. Soreness after doing exercises or activities of daily living is normal as you get back in to your normal routine.  MEDICATIONS:  Try to take narcotic medications and anti-inflammatory medications, such as tylenol, ibuprofen, naprosyn,  etc., with food.  This will minimize stomach upset from the medication.  Should you develop nausea and vomiting from the pain medication, or develop a rash, please discontinue the medication and contact your physician.  You should not drive, make important decisions, or operate machinery when taking narcotic pain medication.  SUNBLOCK Use sun block to incision area over the next year if this area will be exposed to sun. This helps decrease scarring and will allow you avoid a permanent darkened area over your incision.  QUESTIONS:  Please feel free to call our office if you have any questions, and we will be glad to assist you. [phone]

## 2023-01-18 NOTE — Progress Notes (Signed)
01/18/2023  HPI: Kelli Morrison is a 37 y.o. 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  She was last seen on 01/09/2023 because 3 of her 4 port sites had wound dehiscence with some mild erythema at the wound edges.  She was given a course of Keflex.  She presents today for follow-up.  She reports that the incisions are doing much better without any significant redness.  She continues to do gauze dressing over the wounds on a daily basis.  Denies any worsening pain.  Denies any further pain in the lower abdomen.  Vital signs: BP 119/84   Pulse 95   Temp 98 F (36.7 C)   Ht 5' 7.5" (1.715 m)   Wt 261 lb (118.4 kg)   LMP 07/10/2022 (Exact Date)   SpO2 98%   BMI 40.28 kg/m    Physical Exam: Constitutional:  No acute distress Abdomen: Soft, nondistended, with no tenderness to palpation.  The 3 right most port sites have different degrees of wound dehiscence but all superficial.  The right lateral port site has approximately 3 mm dehiscence, the midline port site which was the extraction site has a dehiscence of about 1 cm.  The left medial port site has about 1 mm dehiscence.  The left lateral port site is well-healed.  Overall all the wound beds are healthy without any necrosis or infection.  There is no further erythema.  New dressing applied.  Assessment/Plan: This is a 37 y.o. female s/p robotic assisted small bowel resection with repair of both incisional and umbilical hernias, with wound dehiscence of 3 of the 4 port sites.  - Patient overall is healing better.  There is no further erythema.  The wound bed on all 3 incisions is healthy without any necrosis or purulence.  Discussed with her that she can continue doing the daily dressing changes as she is currently.  This will continue healing on their own.  Can continue applying Neosporin if she wishes.  No further antibiotics are  needed. - Follow-up as needed.   Howie Ill, MD [facility]

## 2023-05-13 ENCOUNTER — Ambulatory Visit (INDEPENDENT_AMBULATORY_CARE_PROVIDER_SITE_OTHER): Payer: Medicaid Other | Admitting: Surgery

## 2023-05-13 ENCOUNTER — Encounter: Payer: Self-pay | Admitting: Surgery

## 2023-05-13 VITALS — BP 125/85 | HR 92 | Temp 97.9°F | Ht 67.5 in | Wt 247.8 lb

## 2023-05-13 DIAGNOSIS — K432 Incisional hernia without obstruction or gangrene: Secondary | ICD-10-CM

## 2023-05-13 NOTE — Patient Instructions (Signed)
Our surgery scheduler Britta Mccreedy will call you within 24-48 hours to get you scheduled. If you have not heard from her after 48 hours, please call our office. Have the blue sheet available when she calls to write down important information.   Laparoscopic Ventral Hernia Repair Laparoscopic ventral hernia repairis a procedure to fix a bulge of tissue that pushes through a weak area of muscle in the abdomen (ventral hernia). A ventral hernia may be: Above the belly button. This is called an epigastric hernia. At the belly button. This is called an umbilical hernia. At the incision site from previous abdominal surgery. This is called an incisional hernia. You may have this procedure as emergency surgery if part of your intestine gets trapped inside the hernia and starts to lose its blood supply (strangulation). Laparoscopic surgery is done through small incisions using a thin surgical telescope with a light and camera (laparoscope). During surgery, your surgeon will use images from the laparoscope to guide the procedure. A mesh screen will be placed in the hernia to close the opening and strengthen the abdominal wall. Tell a health care provider about: Any allergies you have. All medicines you are taking, including vitamins, herbs, eye drops, creams, and over-the-counter medicines. Any problems you or family members have had with anesthetic medicines. Any blood disorders you have. Any surgeries you have had. Any medical conditions you have. Whether you are pregnant or may be pregnant. What are the risks? Generally, this is a safe procedure. However, problems may occur, including: Infection. Bleeding. Damage to nearby structures or organs in the abdomen. Trouble urinating or having a bowel movement after surgery. Blood clots. The hernia coming back after surgery. Fluid buildup in the area of the hernia. In some cases, your health care provider may need to switch from a laparoscopic procedure to a  procedure that is done through a single, larger incision in the abdomen (open procedure). You may need an open procedure if: You have a hernia that is difficult to repair. Your organs are hard to see with the laparoscope. You have bleeding problems during the laparoscopic procedure. What happens before the procedure? Staying hydrated Follow instructions from your health care provider about hydration, which may include: Up to 2 hours before the procedure - you may continue to drink clear liquids, such as water, clear fruit juice, black coffee, and plain tea.  Eating and drinking restrictions Follow instructions from your health care provider about eating and drinking, which may include: 8 hours before the procedure - stop eating heavy meals or foods, such as meat, fried foods, or fatty foods. 6 hours before the procedure - stop eating light meals or foods, such as toast or cereal. 6 hours before the procedure - stop drinking milk or drinks that contain milk. 2 hours before the procedure - stop drinking clear liquids. Medicines Ask your health care provider about: Changing or stopping your regular medicines. This is especially important if you are taking diabetes medicines or blood thinners. Taking medicines such as aspirin and ibuprofen. These medicines can thin your blood. Do not take these medicines unless your health care provider tells you to take them. Taking over-the-counter medicines, vitamins, herbs, and supplements. Tests You may need to have tests before the procedure, such as: Blood tests. Urine tests. Abdominal ultrasound. Chest X-ray. Electrocardiogram (ECG). General instructions You may be asked to take a laxative or do an enema to empty your bowel before surgery (bowel prep). Do not use any products that contain nicotine or  tobacco for at least 4 weeks before the procedure. These products include cigarettes, chewing tobacco, and vaping devices, such as e-cigarettes. If you  need help quitting, ask your health care provider. Ask your health care provider: How your surgery site will be marked. What steps will be taken to help prevent infection. These steps may include: Removing hair at the surgery site. Washing skin with a germ-killing soap. Receiving antibiotic medicine. Plan to have a responsible adult take you home from the hospital or clinic. If you will be going home right after the procedure, plan to have a responsible adult care for you for the time you are told. This is important. What happens during the procedure?  An IV will be inserted into one of your veins. You will be given one or more of the following: A medicine to help you relax (sedative). A medicine to numb the area (local anesthetic). A medicine to make you fall asleep (general anesthetic). A small incision will be made in your abdomen. A hollow metal tube (trocar) will be placed through the incision. A tube will be placed through the trocar to inflate your abdomen with carbon dioxide. This makes it easier for your surgeon to see inside your abdomen during the repair. A laparoscope will be inserted into your abdomen through the trocar. The laparoscope will send images to a monitor in the operating room. Other trocars will be put through other small incisions in your abdomen. The surgical instruments needed for the procedure will be placed through these trocars. The tissue or intestines that make up the hernia will be moved back into place. The edges of the hernia may be stitched (sutured) together. A piece of mesh will be used to close the hernia. Sutures, clips, or staples will be used to keep the mesh in place. A bandage (dressing) or skin glue will be put over the incisions. The procedure may vary among health care providers and hospitals. What happens after the procedure? Your blood pressure, heart rate, breathing rate, and blood oxygen level will be monitored until you leave the hospital  or clinic. You will continue to receive fluids and medicines through an IV. Your IV will be removed when you can drink clear fluids. You will be given pain medicine as needed. You will be encouraged to get up and walk around as soon as possible. You will be shown how to do deep breathing exercises to help prevent a lung infection. If you were given a sedative during the procedure, it can affect you for several hours. Do not drive or operate machinery until your health care provider says that it is safe. Summary Laparoscopic ventral hernia is an operation to fix a hernia using small incisions. Tell your health care provider about other medical conditions that you have and about all the medicines that you are taking. Follow instructions from your health care provider about eating and drinking before the procedure. Plan to have a responsible adult take you home from the hospital or clinic. After the procedure, you will be encouraged to walk as soon as possible. You will also be taught how to do deep breathing exercises. This information is not intended to replace advice given to you by your health care provider. Make sure you discuss any questions you have with your health care provider. Document Revised: 04/08/2020 Document Reviewed: 04/08/2020 Elsevier Patient Education  2024 [employer].

## 2023-05-13 NOTE — Progress Notes (Signed)
05/13/2023  History of Present Illness: Kelli Morrison is a 37 y.o. female status post robotic assisted small bowel resection and incisional and umbilical hernia repair on 12/13/2022.  This was complicated by some mild port dehiscence with erythema which was treated with Keflex and outpatient.  The patient presents today because a few weeks ago she started noticing an area of bulging and discomfort in the epigastric area.  Reports that this is more noticeable at nighttime when she wakes up in the morning with sensation of discomfort.  She reports that her son has cerebral palsy and weighs about 90 lbs and she has to assist him daily.  She denies any worsening pain, nausea, vomiting.  However she does feel at times bloated when things bulge out.  Denies any issues with the prior lower pelvic incision or at the umbilicus.  Past Medical History: Past Medical History:  Diagnosis Date   Anemia    Asthma    Family history of adverse reaction to anesthesia    dad n/v   GERD (gastroesophageal reflux disease)    Headache    migraines   Hypertension    Marijuana use    Meckel's diverticulum    Tobacco dependence      Past Surgical History: Past Surgical History:  Procedure Laterality Date   CESAREAN SECTION     HYSTERECTOMY ABDOMINAL WITH SALPINGECTOMY Bilateral 08/09/2022   Procedure: HYSTERECTOMY ABDOMINAL WITH SALPINGECTOMY;  Surgeon: Schermerhorn, Ihor Austin, MD;  Location: [facility] ORS;  Service: Gynecology;  Laterality: Bilateral;   INCISIONAL HERNIA REPAIR  12/13/2022   Procedure: HERNIA REPAIR INCISIONAL;  Surgeon: Henrene Dodge, MD;  Location: [facility] ORS;  Service: General;;   UMBILICAL HERNIA REPAIR  12/13/2022   Procedure: HERNIA REPAIR UMBILICAL ADULT;  Surgeon: Henrene Dodge, MD;  Location: [facility] ORS;  Service: General;;   WISDOM TOOTH EXTRACTION     XI ROBOTIC ASSISTED SMALL BOWEL RESECTION N/A 12/13/2022   Procedure: XI ROBOTIC ASSISTED SMALL BOWEL RESECTION;  Surgeon: Henrene Dodge, MD;   Location: [facility] ORS;  Service: General;  Laterality: N/A;    Home Medications: Prior to Admission medications   Medication Sig Start Date End Date Taking? Authorizing Provider  albuterol (VENTOLIN HFA) 108 (90 Base) MCG/ACT inhaler Inhale 2 puffs into the lungs every 6 (six) hours as needed. 12/11/21  Yes Rodriguez-Southworth, Nettie Elm, PA-C  ALPRAZolam (XANAX) 0.25 MG tablet Take 0.25 mg by mouth 2 (two) times daily as needed for anxiety.   Yes [provider]  budesonide-formoterol (SYMBICORT) 160-4.5 MCG/ACT inhaler Inhale 2 puffs into the lungs 2 (two) times daily.   Yes [provider]  buPROPion (WELLBUTRIN XL) 300 MG 24 hr tablet Take 300 mg by mouth daily.   Yes [provider]  cetirizine (ZYRTEC) 10 MG tablet Take 10 mg by mouth at bedtime.   Yes [provider]  esomeprazole (NEXIUM) 20 MG packet Take 20 mg by mouth daily before breakfast.   Yes [provider]  losartan-hydrochlorothiazide (HYZAAR) 100-12.5 MG tablet Take 1 tablet by mouth every morning.   Yes [provider]  montelukast (SINGULAIR) 10 MG tablet Take 10 mg by mouth every morning.   Yes [provider]  Multiple Vitamin (MULTIVITAMIN) tablet Take 1 tablet by mouth daily.   Yes [provider]  Secukinumab (COSENTYX, 300 MG DOSE, Shubert) Inject 300 mg into the muscle once a week. 11/15/22  Yes [provider]  Vitamin D, Ergocalciferol, (DRISDOL) 1.25 MG (50000 UNIT) CAPS capsule Take 50,000 Units by  mouth every 7 (seven) days.   Yes [provider]    Allergies: No Known Allergies  Review of Systems: Review of Systems  Constitutional:  Negative for chills and fever.  Respiratory:  Negative for shortness of breath.   Cardiovascular:  Negative for chest pain.  Gastrointestinal:  Positive for abdominal pain. Negative for nausea and vomiting.  Genitourinary:  Negative for dysuria.  Musculoskeletal:  Negative for myalgias.  Skin:   Negative for rash.    Physical Exam BP 125/85   Pulse 92   Temp 97.9 F (36.6 C) (Oral)   Ht 5' 7.5" (1.715 m)   Wt 247 lb 12.8 oz (112.4 kg)   LMP 07/10/2022 (Exact Date)   SpO2 98%   BMI 38.24 kg/m  CONSTITUTIONAL: No acute distress, well-nourished HEENT:  Normocephalic, atraumatic, extraocular motion intact. RESPIRATORY:  Lungs are clear, and breath sounds are equal bilaterally. Normal respiratory effort without pathologic use of accessory muscles. CARDIOVASCULAR: Heart is regular without murmurs, gallops, or rubs. GI: The abdomen is soft, nondistended, with some discomfort to palpation in the upper abdominal area at the site of a prior port.  The patient has a hernia that is about 3.5 cm in size which is reducible, likely containing fatty tissue.  The lower pelvic incision and the umbilical areas do not have any palpable hernia defects. NEUROLOGIC:  Motor and sensation is grossly normal.  Cranial nerves are grossly intact. PSYCH:  Alert and oriented to person, place and time. Affect is normal.  Labs/Imaging: None new  Assessment and Plan: This is a 37 y.o. female status post robotic assisted small bowel resection with repair of an incisional and umbilical hernia.  - Discussed with patient that indeed now it appears that she has an incisional hernia at one of the port sites in the epigastric region.  This was the 12 mm port that was used for stapling and extraction of the small bowel.  The other 2 sites that were repaired on her last surgery remain intact.  Discussed with her that this hernia is about 3.5 cm in size and is reducible.  However given these findings, we would recommend repair to prevent this hernia getting larger or her symptoms to get worse.  She is in agreement. - Discussed with her the plan for a robotic assisted incisional hernia repair and reviewed the surgery at length with her including the planned incisions, risks of bleeding, infection, injury to surrounding  structures, that this would be an outpatient procedure, postoperative activity restrictions, pain control, and she is willing to proceed. - Patient would rather wait until December for her surgery.  I think given that her symptoms are not too significant, would be appropriate to wait until then but I discussed with the patient that if she feels her symptoms worsen at all that we should move the surgery sooner.  For now, we will schedule her for surgery on 08/06/2023.  All of her questions have been answered.  I spent 30 minutes dedicated to the care of this patient on the date of this encounter to include pre-visit review of records, face-to-face time with the patient discussing diagnosis and management, and any post-visit coordination of care.   Howie Ill, MD [facility]

## 2023-05-14 ENCOUNTER — Telehealth: Payer: Self-pay | Admitting: Surgery

## 2023-05-14 NOTE — Telephone Encounter (Signed)
Left message for patient to call, please inform her of the following regarding scheduled surgery with Dr. Aleen Campi.   Pre-Admission date/time, and Surgery date at [facility].  Surgery Date: 08/06/23 Preadmission Testing Date: 07/29/23 (phone 8a-1p)  Also patient will need to call at [phone], between 1-3:00pm the day before surgery, to find out what time to arrive for surgery.

## 2023-05-17 NOTE — Telephone Encounter (Signed)
Called again, patient now informed of all dates regarding surgery.

## 2023-07-22 ENCOUNTER — Encounter: Payer: Self-pay | Admitting: Surgery

## 2023-07-22 ENCOUNTER — Ambulatory Visit: Payer: Medicaid Other | Admitting: Surgery

## 2023-07-26 ENCOUNTER — Ambulatory Visit (INDEPENDENT_AMBULATORY_CARE_PROVIDER_SITE_OTHER): Payer: Medicaid Other | Admitting: Surgery

## 2023-07-26 ENCOUNTER — Encounter: Payer: Self-pay | Admitting: Surgery

## 2023-07-26 VITALS — BP 129/85 | HR 99 | Temp 98.1°F | Ht 67.5 in | Wt 242.6 lb

## 2023-07-26 DIAGNOSIS — K432 Incisional hernia without obstruction or gangrene: Secondary | ICD-10-CM | POA: Diagnosis not present

## 2023-07-26 DIAGNOSIS — K2100 Gastro-esophageal reflux disease with esophagitis, without bleeding: Secondary | ICD-10-CM | POA: Diagnosis not present

## 2023-07-26 MED ORDER — PANTOPRAZOLE SODIUM 40 MG PO TBEC: 40.0000 mg | DELAYED_RELEASE_TABLET | Freq: Every day | ORAL | 2 refills | Status: AC

## 2023-07-26 NOTE — H&P (View-Only) (Signed)
 07/26/2023  History of Present Illness: Kelli Morrison is a 37 y.o. female presenting for H&P update for upcoming surgery. The patient is s/p robotic small bowel resection for Meckel's diverticulum and repair of lower abdominal incisional hernia on 12/13/22.  She unfortunately developed a new incisional hernia at the 12 mm port site in epigastric location.  She was last seen on 05/13/23.  Her upcoming surgery is scheduled for 08/06/23 for a robotic assisted incisional ventral hernia repair.  Today, the patient reports that she feels her hernia has increased in size.  Denies any worsening pain, but reports the bulging is bigger.  She has also been having a lot of acid reflux which is resulting in a dry cough and some nausea.  She has had issues with GERD for many years and had been taking Protonix in the past which was helping, but then changed to OTC Nexium.  This has not been as helpful.  Past Medical History: Past Medical History:  Diagnosis Date   Anemia    Asthma    Family history of adverse reaction to anesthesia    dad n/v   GERD (gastroesophageal reflux disease)    Headache    migraines   Hypertension    Marijuana use    Meckel's diverticulum    Tobacco dependence      Past Surgical History: Past Surgical History:  Procedure Laterality Date   CESAREAN SECTION     HYSTERECTOMY ABDOMINAL WITH SALPINGECTOMY Bilateral 08/09/2022   Procedure: HYSTERECTOMY ABDOMINAL WITH SALPINGECTOMY;  Surgeon: Schermerhorn, Ihor Austin, MD;  Location: [facility];  Service: Gynecology;  Laterality: Bilateral;   INCISIONAL HERNIA REPAIR  12/13/2022   Procedure: HERNIA REPAIR INCISIONAL;  Surgeon: Henrene Dodge, MD;  Location: [facility];  Service: General;;   UMBILICAL HERNIA REPAIR  12/13/2022   Procedure: HERNIA REPAIR UMBILICAL ADULT;  Surgeon: Henrene Dodge, MD;  Location: [facility];  Service: General;;   WISDOM TOOTH EXTRACTION     XI ROBOTIC ASSISTED SMALL BOWEL RESECTION N/A 12/13/2022   Procedure:  XI ROBOTIC ASSISTED SMALL BOWEL RESECTION;  Surgeon: Henrene Dodge, MD;  Location: [facility];  Service: General;  Laterality: N/A;    Home Medications: Prior to Admission medications   Medication Sig Start Date End Date Taking? Authorizing Provider  albuterol (VENTOLIN HFA) 108 (90 Base) MCG/ACT inhaler Inhale 2 puffs into the lungs every 6 (six) hours as needed. 12/11/21  Yes Rodriguez-Southworth, Nettie Elm, PA-C  ALPRAZolam (XANAX) 0.25 MG tablet Take 0.25 mg by mouth 2 (two) times daily as needed for anxiety.   Yes [provider]  atomoxetine (STRATTERA) 80 MG capsule Take 80 mg by mouth every morning. 07/01/23  Yes [provider]  b complex vitamins capsule Take 1 capsule by mouth daily.   Yes [provider]  budesonide-formoterol (SYMBICORT) 160-4.5 MCG/ACT inhaler Inhale 2 puffs into the lungs 2 (two) times daily.   Yes [provider]  buPROPion (WELLBUTRIN XL) 300 MG 24 hr tablet Take 300 mg by mouth daily.   Yes [provider]  cetirizine (ZYRTEC) 10 MG tablet Take 10 mg by mouth at bedtime.   Yes [provider]  COSENTYX UNOREADY 300 MG/2ML SOAJ Inject 300 mg into the skin every 28 (twenty-eight) days. 07/22/23  Yes [provider]  CRANBERRY PO Take 1 tablet by mouth daily.   Yes [provider]  diclofenac (VOLTAREN) 75 MG EC tablet Take 75 mg by mouth 2 (two) times daily as needed for moderate pain (pain score  4-6). 06/24/23  Yes [provider]  esomeprazole (NEXIUM) 20 MG packet Take 60 mg by mouth daily before breakfast.   Yes [provider]  Fish Oil-Coenzyme Q10 (FISH OIL PLUS CO Q-10 PO) Take 1 capsule by mouth daily.   Yes [provider]  losartan-hydrochlorothiazide (HYZAAR) 100-12.5 MG tablet Take 1 tablet by mouth every morning.   Yes [provider]  MAGNESIUM PO Take 1 tablet by mouth daily.   Yes [provider]  montelukast (SINGULAIR) 10 MG tablet Take  10 mg by mouth every morning.   Yes [provider]  Multiple Vitamin (MULTIVITAMIN) tablet Take 1 tablet by mouth daily.   Yes [provider]  pantoprazole (PROTONIX) 40 MG tablet Take 1 tablet (40 mg total) by mouth daily. 07/26/23  Yes Massa Pe, Elita Quick, MD  SUMAtriptan (IMITREX) 25 MG tablet Take 25 mg by mouth every 2 (two) hours as needed for migraine.   Yes [provider]  triamcinolone ointment (KENALOG) 0.1 % Apply 1 Application topically 2 (two) times daily. 04/16/23  Yes [provider]  Vitamin D, Ergocalciferol, (DRISDOL) 1.25 MG (50000 UNIT) CAPS capsule Take 50,000 Units by mouth every 7 (seven) days.   Yes [provider]  vitamin E 180 MG (400 UNITS) capsule Take 400 Units by mouth daily.   Yes [provider]    Allergies: No Known Allergies  Review of Systems: Review of Systems  Constitutional:  Negative for chills and fever.  Respiratory:  Positive for cough. Negative for shortness of breath.   Cardiovascular:  Negative for chest pain.  Gastrointestinal:  Positive for nausea. Negative for abdominal pain and vomiting.  Genitourinary:  Negative for dysuria.    Physical Exam BP 129/85   Pulse 99   Temp 98.1 F (36.7 C) (Oral)   Ht 5' 7.5" (1.715 m)   Wt 242 lb 9.6 oz (110 kg)   LMP 07/10/2022 (Exact Date)   SpO2 96%   BMI 37.44 kg/m  CONSTITUTIONAL: No acute distress HEENT:  Normocephalic, atraumatic, extraocular motion intact. RESPIRATORY:  Normal respiratory effort without pathologic use of accessory muscles. CARDIOVASCULAR: Regular rhythm and rate. GI: The abdomen is soft, non-distended, with some soreness in epigastric region when pushing/reducing her hernia.  Hernia itself is soft, easily reducible, without any evidence of incarceration or strangulation.  Measures about 3.5 cm, stable in size.  NEUROLOGIC:  Motor and sensation is grossly normal.  Cranial nerves are grossly intact. PSYCH:  Alert and oriented  to person, place and time. Affect is normal.  Labs/Imaging: None new  Assessment and Plan: This is a 37 y.o. female with an incisional epigastric ventral hernia.  --Discussed with the patient that the size of the hernia is about two fingerbreadths, although the amount that is bulging may be larger compared to before.  Overall would keep the same plan for a robotic assisted hernia repair.  Reviewed with her again the surgical plan including the planned incisions, the risks of bleeding, infection, injury to surrounding structures, that this would be an outpatient surgery, activity restrictions, and she's willing to proceed. --With regards to her reflux, it appears that Nexium is not working well for her.  She reports that Protonix was helping more in the past, but her prescription ended and did not have it refilled.  For now, I'll send a prescription for Protonix, and will also place referral for GI for further eval/management. --Patient is scheduled for surgery on 08/06/23. --All of her questions have been answered.  I spent 20 minutes dedicated to the care of this patient on the date of this encounter to include pre-visit review of records, face-to-face time with the patient discussing diagnosis and management, and any post-visit coordination of care.   Howie Ill, MD [facility]

## 2023-07-26 NOTE — Progress Notes (Signed)
07/26/2023  History of Present Illness: Kelli Morrison is a 37 y.o. female presenting for H&P update for upcoming surgery. The patient is s/p robotic small bowel resection for Meckel's diverticulum and repair of lower abdominal incisional hernia on 12/13/22.  She unfortunately developed a new incisional hernia at the 12 mm port site in epigastric location.  She was last seen on 05/13/23.  Her upcoming surgery is scheduled for 08/06/23 for a robotic assisted incisional ventral hernia repair.  Today, the patient reports that she feels her hernia has increased in size.  Denies any worsening pain, but reports the bulging is bigger.  She has also been having a lot of acid reflux which is resulting in a dry cough and some nausea.  She has had issues with GERD for many years and had been taking Protonix in the past which was helping, but then changed to OTC Nexium.  This has not been as helpful.  Past Medical History: Past Medical History:  Diagnosis Date   Anemia    Asthma    Family history of adverse reaction to anesthesia    dad n/v   GERD (gastroesophageal reflux disease)    Headache    migraines   Hypertension    Marijuana use    Meckel's diverticulum    Tobacco dependence      Past Surgical History: Past Surgical History:  Procedure Laterality Date   CESAREAN SECTION     HYSTERECTOMY ABDOMINAL WITH SALPINGECTOMY Bilateral 08/09/2022   Procedure: HYSTERECTOMY ABDOMINAL WITH SALPINGECTOMY;  Surgeon: Schermerhorn, Ihor Austin, MD;  Location: [facility];  Service: Gynecology;  Laterality: Bilateral;   INCISIONAL HERNIA REPAIR  12/13/2022   Procedure: HERNIA REPAIR INCISIONAL;  Surgeon: Henrene Dodge, MD;  Location: [facility];  Service: General;;   UMBILICAL HERNIA REPAIR  12/13/2022   Procedure: HERNIA REPAIR UMBILICAL ADULT;  Surgeon: Henrene Dodge, MD;  Location: [facility];  Service: General;;   WISDOM TOOTH EXTRACTION     XI ROBOTIC ASSISTED SMALL BOWEL RESECTION N/A 12/13/2022   Procedure:  XI ROBOTIC ASSISTED SMALL BOWEL RESECTION;  Surgeon: Henrene Dodge, MD;  Location: [facility];  Service: General;  Laterality: N/A;    Home Medications: Prior to Admission medications   Medication Sig Start Date End Date Taking? Authorizing Provider  albuterol (VENTOLIN HFA) 108 (90 Base) MCG/ACT inhaler Inhale 2 puffs into the lungs every 6 (six) hours as needed. 12/11/21  Yes Rodriguez-Southworth, Nettie Elm, PA-C  ALPRAZolam (XANAX) 0.25 MG tablet Take 0.25 mg by mouth 2 (two) times daily as needed for anxiety.   Yes [provider]  atomoxetine (STRATTERA) 80 MG capsule Take 80 mg by mouth every morning. 07/01/23  Yes [provider]  b complex vitamins capsule Take 1 capsule by mouth daily.   Yes [provider]  budesonide-formoterol (SYMBICORT) 160-4.5 MCG/ACT inhaler Inhale 2 puffs into the lungs 2 (two) times daily.   Yes [provider]  buPROPion (WELLBUTRIN XL) 300 MG 24 hr tablet Take 300 mg by mouth daily.   Yes [provider]  cetirizine (ZYRTEC) 10 MG tablet Take 10 mg by mouth at bedtime.   Yes [provider]  COSENTYX UNOREADY 300 MG/2ML SOAJ Inject 300 mg into the skin every 28 (twenty-eight) days. 07/22/23  Yes [provider]  CRANBERRY PO Take 1 tablet by mouth daily.   Yes [provider]  diclofenac (VOLTAREN) 75 MG EC tablet Take 75 mg by mouth 2 (two) times daily as needed for moderate pain (pain score  4-6). 06/24/23  Yes [provider]  esomeprazole (NEXIUM) 20 MG packet Take 60 mg by mouth daily before breakfast.   Yes [provider]  Fish Oil-Coenzyme Q10 (FISH OIL PLUS CO Q-10 PO) Take 1 capsule by mouth daily.   Yes [provider]  losartan-hydrochlorothiazide (HYZAAR) 100-12.5 MG tablet Take 1 tablet by mouth every morning.   Yes [provider]  MAGNESIUM PO Take 1 tablet by mouth daily.   Yes [provider]  montelukast (SINGULAIR) 10 MG tablet Take  10 mg by mouth every morning.   Yes [provider]  Multiple Vitamin (MULTIVITAMIN) tablet Take 1 tablet by mouth daily.   Yes [provider]  pantoprazole (PROTONIX) 40 MG tablet Take 1 tablet (40 mg total) by mouth daily. 07/26/23  Yes Massa Pe, Elita Quick, MD  SUMAtriptan (IMITREX) 25 MG tablet Take 25 mg by mouth every 2 (two) hours as needed for migraine.   Yes [provider]  triamcinolone ointment (KENALOG) 0.1 % Apply 1 Application topically 2 (two) times daily. 04/16/23  Yes [provider]  Vitamin D, Ergocalciferol, (DRISDOL) 1.25 MG (50000 UNIT) CAPS capsule Take 50,000 Units by mouth every 7 (seven) days.   Yes [provider]  vitamin E 180 MG (400 UNITS) capsule Take 400 Units by mouth daily.   Yes [provider]    Allergies: No Known Allergies  Review of Systems: Review of Systems  Constitutional:  Negative for chills and fever.  Respiratory:  Positive for cough. Negative for shortness of breath.   Cardiovascular:  Negative for chest pain.  Gastrointestinal:  Positive for nausea. Negative for abdominal pain and vomiting.  Genitourinary:  Negative for dysuria.    Physical Exam BP 129/85   Pulse 99   Temp 98.1 F (36.7 C) (Oral)   Ht 5' 7.5" (1.715 m)   Wt 242 lb 9.6 oz (110 kg)   LMP 07/10/2022 (Exact Date)   SpO2 96%   BMI 37.44 kg/m  CONSTITUTIONAL: No acute distress HEENT:  Normocephalic, atraumatic, extraocular motion intact. RESPIRATORY:  Normal respiratory effort without pathologic use of accessory muscles. CARDIOVASCULAR: Regular rhythm and rate. GI: The abdomen is soft, non-distended, with some soreness in epigastric region when pushing/reducing her hernia.  Hernia itself is soft, easily reducible, without any evidence of incarceration or strangulation.  Measures about 3.5 cm, stable in size.  NEUROLOGIC:  Motor and sensation is grossly normal.  Cranial nerves are grossly intact. PSYCH:  Alert and oriented  to person, place and time. Affect is normal.  Labs/Imaging: None new  Assessment and Plan: This is a 37 y.o. female with an incisional epigastric ventral hernia.  --Discussed with the patient that the size of the hernia is about two fingerbreadths, although the amount that is bulging may be larger compared to before.  Overall would keep the same plan for a robotic assisted hernia repair.  Reviewed with her again the surgical plan including the planned incisions, the risks of bleeding, infection, injury to surrounding structures, that this would be an outpatient surgery, activity restrictions, and she's willing to proceed. --With regards to her reflux, it appears that Nexium is not working well for her.  She reports that Protonix was helping more in the past, but her prescription ended and did not have it refilled.  For now, I'll send a prescription for Protonix, and will also place referral for GI for further eval/management. --Patient is scheduled for surgery on 08/06/23. --All of her questions have been answered.  I spent 20 minutes dedicated to the care of this patient on the date of this encounter to include pre-visit review of records, face-to-face time with the patient discussing diagnosis and management, and any post-visit coordination of care.   Howie Ill, MD [facility]

## 2023-07-26 NOTE — Patient Instructions (Signed)
You have requested to have a Ventral Hernia Repair. This will be done by Dr Aleen Campi at [facility]. Please see your (BLUE) Pre-care sheet for more information. Our surgery scheduler will call you to look at surgery dates and to go over surgery information.   You will need to arrange to be out of work for approximately 1-2 weeks and then you may return with a lifting restriction for 4 more weeks. If you have FMLA or Disability paperwork that needs to be filled out, please have your company fax your paperwork to [phone] or you may drop this by either office. This paperwork will be filled out within 3 days after your surgery has been completed.     Ventral Hernia A ventral hernia (also called an incisional hernia) is a hernia that occurs at the site of a previous surgical cut (incision) in the abdomen. The abdominal wall spans from your lower chest down to your pelvis. If the abdominal wall is weakened from a surgical incision, a hernia can occur. A hernia is a bulge of bowel or muscle tissue pushing out on the weakened part of the abdominal wall. Ventral hernias can get bigger from straining or lifting. Obese and older people are at higher risk for a ventral hernia. People who develop infections after surgery or require repeat incisions at the same site on the abdomen are also at increased risk. CAUSES  A ventral hernia occurs because of weakness in the abdominal wall at an incision site.  SYMPTOMS  Common symptoms include: A visible bulge or lump on the abdominal wall. Pain or tenderness around the lump. Increased discomfort if you cough or make a sudden movement. If the hernia has blocked part of the intestine, a serious complication can occur (incarcerated or strangulated hernia). This can become a problem that requires emergency surgery because the blood flow to the blocked intestine may be cut off. Symptoms may include: Feeling sick to your stomach (nauseous). Throwing up (vomiting). Stomach  swelling (distention) or bloating. Fever. Rapid heartbeat. DIAGNOSIS  Your health care provider will take a medical history and perform a physical exam. Various tests may be ordered, such as: Blood tests. Urine tests. Ultrasonography. X-rays. Computed tomography (CT). TREATMENT  Watchful waiting may be all that is needed for a smaller hernia that does not cause symptoms. Your health care provider may recommend the use of a supportive belt (truss) that helps to keep the abdominal wall intact. For larger hernias or those that cause pain, surgery to repair the hernia is usually recommended. If a hernia becomes strangulated, emergency surgery needs to be done right away. HOME CARE INSTRUCTIONS Avoid putting pressure or strain on the abdominal area. Avoid heavy lifting. Use good body positioning for physical tasks. Ask your health care provider about proper body positioning. Use a supportive belt as directed by your health care provider. Maintain a healthy weight. Eat foods that are high in fiber, such as whole grains, fruits, and vegetables. Fiber helps prevent difficult bowel movements (constipation). Drink enough fluids to keep your urine clear or pale yellow. Follow up with your health care provider as directed. SEEK MEDICAL CARE IF:  Your hernia seems to be getting larger or more painful. SEEK IMMEDIATE MEDICAL CARE IF:  You have abdominal pain that is sudden and sharp. Your pain becomes severe. You have repeated vomiting. You are sweating a lot. You notice a rapid heartbeat. You develop a fever. MAKE SURE YOU:  Understand these instructions. Will watch your condition. Will get help  right away if you are not doing well or get worse.     Open Ventral Hernia Repair Open ventral hernia repair is a surgery to fix a ventral hernia. A ventral hernia,  is a bulge of body tissue or intestines that pushes through the front part of the abdomen. This can happen if the connective tissue  covering the muscles over the abdomen has a weak spot or is torn because of a surgical cut (incision) from a previous surgery. A ventral hernia repair is often done soon after diagnosis to stop the hernia from getting bigger, becoming uncomfortable, or becoming an emergency. This surgery usually takes about 2 hours, but the time can vary greatly.  LET [facility] CARE PROVIDER KNOW ABOUT: Any allergies you have. All medicines you are taking, including steroids, vitamins, herbs, eye drops, creams, and over-the-counter medicines. Previous problems you or members of your family have had with the use of anesthetics. Any blood disorders you have. Previous surgeries you have had. Medical conditions you have.  RISKS AND COMPLICATIONS  Generally, Open ventral hernia repair is a safe procedure. However, as with any surgical procedure, problems can occur. Possible problems include: Bleeding. Trouble passing urine or having a bowel movement after the surgery. Infection. Pneumonia. Blood clots. Pain in the area of the hernia. A bulge in the area of the hernia that may be caused by a collection of fluid. Injury to intestines or other structures in the abdomen. Return of the hernia after surgery.  BEFORE THE PROCEDURE  You may need to have blood tests, urine tests, a chest X-ray, or an electrocardiogram done before the day of the surgery. Ask your health care provider about changing or stopping your regular medicines. This is especially important if you are taking diabetes medicines or blood thinners. You may need to wash with a special type of germ-killing soap. Do not eat or drink anything after midnight the night before the procedure or as directed by your health care provider. Make plans to have someone drive you home after the procedure.  PROCEDURE  Small monitors will be put on your body. They are used to check your heart, blood pressure, and oxygen level. An IV access tube will be put into a  vein in your hand or arm. Fluids and medicine will flow directly into your body through the IV tube. You will be given medicine that makes you go to sleep (general anesthetic). Your abdomen will be cleaned with a special soap to kill any germs on your skin. Once you are asleep, a moderate - large size incision will be made in your abdomen. The size of incision depends on how large your hernia is. Your surgeon puts the tissue or intestines that formed the hernia back in place. A screen-like patch (mesh) is used to close the hernia. This helps make the area stronger. Stitches, tacks, or staples are used to keep the mesh in place. Medicine and a bandage (dressing) or skin glue will be put over the incision.  AFTER THE PROCEDURE  You will stay in a recovery area until the anesthetic wears off. Your blood pressure and pulse will be checked often. You may be able to go home the same day or may need to stay in the hospital for 1-2 days after surgery. Your surgeon will decide when you can go home depending upon your recovery. You may feel some pain. You will be given medicine for pain. You will be urged to do breathing exercises that involve  taking deep breaths. This helps prevent a lung infection after a surgery. You may have to wear compression stockings while you are in the hospital. These stockings help keep blood clots from forming in your legs.   This information is not intended to replace advice given to you by your health care provider. Make sure you discuss any questions you have with your health care provider.   Document Released: 08/06/2012 Document Revised: 08/25/2013 Document Reviewed: 08/06/2012 Else[employer] Interactive Patient Education [employer]

## 2023-07-29 ENCOUNTER — Other Ambulatory Visit: Payer: Self-pay

## 2023-07-29 ENCOUNTER — Encounter
Admission: RE | Admit: 2023-07-29 | Discharge: 2023-07-29 | Disposition: A | Discharge status: Home / Self Care | Payer: Medicaid Other | Source: Ambulatory Visit | Attending: Surgery | Admitting: Surgery

## 2023-07-29 DIAGNOSIS — K429 Umbilical hernia without obstruction or gangrene: Secondary | ICD-10-CM

## 2023-07-29 DIAGNOSIS — Z01812 Encounter for preprocedural laboratory examination: Secondary | ICD-10-CM

## 2023-07-29 NOTE — Patient Instructions (Addendum)
Your procedure is scheduled on: 08/06/2023 Tuesday Report to the Registration Desk on the 1st floor of the [facility]. To find out your arrival time, please call [phone] between 1PM - 3PM on: Monday, 08/05/2023  If your arrival time is 6:00 am, do not arrive before that time as the [facility] entrance doors do not open until 6:00 am.  REMEMBER: Instructions that are not followed completely may result in serious medical risk, up to and including death; or upon the discretion of your surgeon and anesthesiologist your surgery may need to be rescheduled.  Do not eat food after midnight the night before surgery.  No gum chewing or hard candies.  You may however, drink CLEAR liquids up to 2 hours before you are scheduled to arrive for your surgery. Do not drink anything within 2 hours of your scheduled arrival time.  Clear liquids include: - water  - apple juice without pulp - gatorade (not RED colors) - black coffee or tea (Do NOT add milk or creamers to the coffee or tea) Do NOT drink anything that is not on this list.    One week prior to surgery: Stop Anti-inflammatories (NSAIDS) such as Advil, Aleve, Ibuprofen, Motrin, Naproxen, Naprosyn and Aspirin based products such as Excedrin, Goody's Powder, BC Powder and Voltaren Stop ANY OVER THE COUNTER supplements until after surgery.  You may however, continue to take Tylenol if needed for pain up until the day of surgery.   Continue taking all of your other prescription medications up until the day of surgery.  ON THE DAY OF SURGERY ONLY TAKE THESE MEDICATIONS WITH SIPS OF WATER:  ALPRAZolam (XANAX) buPROPion (WELLBUTRIN XL) pantoprazole (PROTONIX)  Use symbicort inhaler on the day of surgery and bring albuterol to the hospital.  No Alcohol for 24 hours before or after surgery.  No Smoking including e-cigarettes for 24 hours before surgery.  No chewable tobacco products for at least 6 hours before surgery.  No nicotine  patches on the day of surgery.  Do not use any "recreational" drugs for at least a week (preferably 2 weeks) before your surgery.  Please be advised that the combination of cocaine and anesthesia may have negative outcomes, up to and including death. If you test positive for cocaine, your surgery will be cancelled.  On the morning of surgery brush your teeth with toothpaste and water, you may rinse your mouth with mouthwash if you wish. Do not swallow any toothpaste or mouthwash.  Use CHG Soap or wipes as directed on instruction sheet.  Do not wear jewelry, make-up, hairpins, clips or nail polish.  For welded (permanent) jewelry: bracelets, anklets, waist bands, etc.  Please have this removed prior to surgery.  If it is not removed, there is a chance that hospital personnel will need to cut it off on the day of surgery.  Do not wear lotions, powders, or perfumes.   Do not shave body hair from the neck down 48 hours before surgery.  Contact lenses, hearing aids and dentures may not be worn into surgery.  Do not bring valuables to the hospital. [facility] is not responsible for any missing/lost belongings or valuables.    Notify your doctor if there is any change in your medical condition (cold, fever, infection).  Wear comfortable clothing (specific to your surgery type) to the hospital.  After surgery, you can help prevent lung complications by doing breathing exercises.  Take deep breaths and cough every 1-2 hours. Your doctor may order a  device called an Incentive Spirometer to help you take deep breaths. When coughing or sneezing, hold a pillow firmly against your incision with both hands. This is called "splinting." Doing this helps protect your incision. It also decreases belly discomfort.  If you are being admitted to the hospital overnight, leave your suitcase in the car. After surgery it may be brought to your room.  If you are being discharged the day of surgery, you will  not be allowed to drive home. You will need a responsible individual to drive you home and stay with you for 24 hours after surgery.    Please call the Pre-admissions Testing Dept. at [phone] if you have any questions about these instructions.  Surgery Visitation Policy:  Patients having surgery or a procedure may have two visitors.  Children under the age of 13 must have an adult with them who is not the patient.  Inpatient Visitation:    Visiting hours are 7 a.m. to 8 p.m. Up to four visitors are allowed at one time in a patient room. The visitors may rotate out with other people during the day.  One visitor age 78 or older may stay with the patient overnight and must be in the room by 8 p.m.      Preparing for Surgery with CHLORHEXIDINE GLUCONATE (CHG) Soap  Chlorhexidine Gluconate (CHG) Soap  o An antiseptic cleaner that kills germs and bonds with the skin to continue killing germs even after washing  o Used for showering the night before surgery and morning of surgery  Before surgery, you can play an important role by reducing the number of germs on your skin.  CHG (Chlorhexidine gluconate) soap is an antiseptic cleanser which kills germs and bonds with the skin to continue killing germs even after washing.  Please do not use if you have an allergy to CHG or antibacterial soaps. If your skin becomes reddened/irritated stop using the CHG.  1. Shower the NIGHT BEFORE SURGERY and the MORNING OF SURGERY with CHG soap.  2. If you choose to wash your hair, wash your hair first as usual with your normal shampoo.  3. After shampooing, rinse your hair and body thoroughly to remove the shampoo.  4. Use CHG as you would any other liquid soap. You can apply CHG directly to the skin and wash gently with a scrungie or a clean washcloth.  5. Apply the CHG soap to your body only from the neck down. Do not use on open wounds or open sores. Avoid contact with your eyes, ears,  mouth, and genitals (private parts). Wash face and genitals (private parts) with your normal soap.  6. Wash thoroughly, paying special attention to the area where your surgery will be performed.  7. Thoroughly rinse your body with warm water.  8. Do not shower/wash with your normal soap after using and rinsing off the CHG soap.  9. Pat yourself dry with a clean towel.  10. Wear clean pajamas to bed the night before surgery.  12. Place clean sheets on your bed the night of your first shower and do not sleep with pets.  13. Shower again with the CHG soap on the day of surgery prior to arriving at the hospital.  14. Do not apply any deodorants/lotions/powders.  15. Please wear clean clothes to the hospital.

## 2023-07-30 ENCOUNTER — Inpatient Hospital Stay
Admission: RE | Admit: 2023-07-30 | Discharge: 2023-07-30 | Discharge status: Home / Self Care | Payer: Medicaid Other | Source: Ambulatory Visit | Attending: Surgery

## 2023-07-30 ENCOUNTER — Encounter: Payer: Self-pay | Admitting: Urgent Care

## 2023-07-30 DIAGNOSIS — Z01812 Encounter for preprocedural laboratory examination: Secondary | ICD-10-CM

## 2023-07-30 DIAGNOSIS — K429 Umbilical hernia without obstruction or gangrene: Secondary | ICD-10-CM | POA: Insufficient documentation

## 2023-07-30 DIAGNOSIS — Z01810 Encounter for preprocedural cardiovascular examination: Secondary | ICD-10-CM | POA: Diagnosis not present

## 2023-07-30 DIAGNOSIS — Z01818 Encounter for other preprocedural examination: Secondary | ICD-10-CM | POA: Insufficient documentation

## 2023-07-30 LAB — BASIC METABOLIC PANEL
Anion gap: 8 (ref 5–15)
BUN: 14 mg/dL (ref 6–20)
CO2: 25 mmol/L (ref 22–32)
Calcium: 8.7 mg/dL — ABNORMAL LOW (ref 8.9–10.3)
Chloride: 103 mmol/L (ref 98–111)
Creatinine, Ser: 0.65 mg/dL (ref 0.44–1.00)
GFR, Estimated: >60 mL/min (ref >60)
Glucose, Bld: 90 mg/dL (ref 70–99)
Potassium: 3.8 mmol/L (ref 3.5–5.1)
Sodium: 136 mmol/L (ref 135–145)

## 2023-07-30 LAB — CBC
HCT: 39.6 % (ref 36.0–46.0)
Hemoglobin: 13.1 g/dL (ref 12.0–15.0)
MCH: 29.5 pg (ref 26.0–34.0)
MCHC: 33.1 g/dL (ref 30.0–36.0)
MCV: 89.2 fL (ref 80.0–100.0)
Platelets: 462 10*3/uL — ABNORMAL HIGH (ref 150–400)
RBC: 4.44 MIL/uL (ref 3.87–5.11)
RDW: 16.4 % — ABNORMAL HIGH (ref 11.5–15.5)
WBC: 10.4 10*3/uL (ref 4.0–10.5)
nRBC: 0 % (ref 0.0–0.2)

## 2023-08-05 MED ORDER — CHLORHEXIDINE GLUCONATE 0.12 % MT SOLN
15.0000 mL | Freq: Once | OROMUCOSAL | Status: AC
  Administered 2023-08-06: 15 mL via OROMUCOSAL

## 2023-08-05 MED ORDER — LACTATED RINGERS IV SOLN: INTRAVENOUS | Status: DC

## 2023-08-05 MED ORDER — CEFAZOLIN SODIUM-DEXTROSE 2-4 GM/100ML-% IV SOLN
2.0000 g | INTRAVENOUS | Status: AC
Start: 2023-08-06 — End: 2023-08-07
  Administered 2023-08-06: 2 g via INTRAVENOUS

## 2023-08-05 MED ORDER — GABAPENTIN 300 MG PO CAPS
300.0000 mg | ORAL_CAPSULE | ORAL | Status: AC
  Administered 2023-08-06: 300 mg via ORAL

## 2023-08-05 MED ORDER — ACETAMINOPHEN 500 MG PO TABS
1000.0000 mg | ORAL_TABLET | ORAL | Status: AC
  Administered 2023-08-06: 1000 mg via ORAL

## 2023-08-05 MED ORDER — CHLORHEXIDINE GLUCONATE CLOTH 2 % EX PADS: 6.0000 | MEDICATED_PAD | Freq: Once | CUTANEOUS | Status: DC

## 2023-08-05 MED ORDER — ORAL CARE MOUTH RINSE: 15.0000 mL | Freq: Once | OROMUCOSAL | Status: AC

## 2023-08-05 MED ORDER — BUPIVACAINE LIPOSOME 1.3 % IJ SUSP: 20.0000 mL | Freq: Once | INTRAMUSCULAR | Status: DC

## 2023-08-06 ENCOUNTER — Ambulatory Visit: Payer: Self-pay

## 2023-08-06 ENCOUNTER — Ambulatory Visit: Payer: Medicaid Other

## 2023-08-06 ENCOUNTER — Other Ambulatory Visit: Payer: Self-pay

## 2023-08-06 ENCOUNTER — Encounter
Admission: RE | Disposition: A | Discharge status: Home / Self Care | Payer: Self-pay | Source: Ambulatory Visit | Attending: Surgery

## 2023-08-06 ENCOUNTER — Ambulatory Visit
Admission: RE | Admit: 2023-08-06 | Discharge: 2023-08-06 | Disposition: A | Discharge status: Home / Self Care | Payer: Medicaid Other | Source: Ambulatory Visit | Attending: Surgery | Admitting: Surgery

## 2023-08-06 ENCOUNTER — Encounter: Payer: Self-pay | Admitting: Surgery

## 2023-08-06 DIAGNOSIS — I10 Essential (primary) hypertension: Secondary | ICD-10-CM | POA: Diagnosis not present

## 2023-08-06 DIAGNOSIS — K439 Ventral hernia without obstruction or gangrene: Secondary | ICD-10-CM | POA: Insufficient documentation

## 2023-08-06 DIAGNOSIS — Z7951 Long term (current) use of inhaled steroids: Secondary | ICD-10-CM | POA: Diagnosis not present

## 2023-08-06 DIAGNOSIS — Z6841 Body Mass Index (BMI) 40.0 and over, adult: Secondary | ICD-10-CM | POA: Diagnosis not present

## 2023-08-06 DIAGNOSIS — K219 Gastro-esophageal reflux disease without esophagitis: Secondary | ICD-10-CM | POA: Diagnosis not present

## 2023-08-06 DIAGNOSIS — Z9049 Acquired absence of other specified parts of digestive tract: Secondary | ICD-10-CM | POA: Diagnosis not present

## 2023-08-06 DIAGNOSIS — K432 Incisional hernia without obstruction or gangrene: Secondary | ICD-10-CM

## 2023-08-06 DIAGNOSIS — J45909 Unspecified asthma, uncomplicated: Secondary | ICD-10-CM | POA: Diagnosis not present

## 2023-08-06 DIAGNOSIS — K429 Umbilical hernia without obstruction or gangrene: Secondary | ICD-10-CM | POA: Diagnosis not present

## 2023-08-06 HISTORY — PX: XI ROBOTIC ASSISTED VENTRAL HERNIA: SHX6789

## 2023-08-06 HISTORY — PX: INSERTION OF MESH: SHX5868

## 2023-08-06 SURGERY — REPAIR, HERNIA, VENTRAL, ROBOT-ASSISTED
Anesthesia: General | Site: Abdomen

## 2023-08-06 MED ORDER — DROPERIDOL 2.5 MG/ML IJ SOLN: 0.6250 mg | Freq: Once | INTRAMUSCULAR | Status: DC | PRN

## 2023-08-06 MED ORDER — PROPOFOL 10 MG/ML IV BOLUS
INTRAVENOUS | Status: AC
  Filled 2023-08-06: qty 40

## 2023-08-06 MED ORDER — ONDANSETRON HCL 4 MG PO TABS: 4.0000 mg | ORAL_TABLET | Freq: Three times a day (TID) | ORAL | 0 refills | Status: AC | PRN

## 2023-08-06 MED ORDER — OXYCODONE HCL 5 MG PO TABS: 5.0000 mg | ORAL_TABLET | ORAL | 0 refills | Status: DC | PRN

## 2023-08-06 MED ORDER — FENTANYL CITRATE (PF) 100 MCG/2ML IJ SOLN
INTRAMUSCULAR | Status: DC | PRN
  Administered 2023-08-06: 100 ug via INTRAVENOUS

## 2023-08-06 MED ORDER — LIDOCAINE HCL (CARDIAC) PF 100 MG/5ML IV SOSY
PREFILLED_SYRINGE | INTRAVENOUS | Status: DC | PRN
  Administered 2023-08-06: 80 mg via INTRAVENOUS

## 2023-08-06 MED ORDER — PROPOFOL 10 MG/ML IV BOLUS
INTRAVENOUS | Status: DC | PRN
  Administered 2023-08-06 (×2): 50 mg via INTRAVENOUS
  Administered 2023-08-06: 200 mg via INTRAVENOUS

## 2023-08-06 MED ORDER — ROCURONIUM BROMIDE 10 MG/ML (PF) SYRINGE
PREFILLED_SYRINGE | INTRAVENOUS | Status: AC
  Filled 2023-08-06: qty 10

## 2023-08-06 MED ORDER — OXYCODONE HCL 5 MG PO TABS
ORAL_TABLET | ORAL | Status: AC
  Filled 2023-08-06: qty 1

## 2023-08-06 MED ORDER — FENTANYL CITRATE (PF) 100 MCG/2ML IJ SOLN
INTRAMUSCULAR | Status: AC
  Filled 2023-08-06: qty 2

## 2023-08-06 MED ORDER — DEXMEDETOMIDINE HCL IN NACL 80 MCG/20ML IV SOLN
INTRAVENOUS | Status: AC
  Filled 2023-08-06: qty 20

## 2023-08-06 MED ORDER — KETOROLAC TROMETHAMINE 30 MG/ML IJ SOLN
INTRAMUSCULAR | Status: AC
Start: 2023-08-06 — End: ?
  Filled 2023-08-06: qty 1

## 2023-08-06 MED ORDER — ONDANSETRON HCL 4 MG/2ML IJ SOLN
INTRAMUSCULAR | Status: AC
  Filled 2023-08-06: qty 2

## 2023-08-06 MED ORDER — HYDROMORPHONE HCL 1 MG/ML IJ SOLN
INTRAMUSCULAR | Status: AC
  Filled 2023-08-06: qty 1

## 2023-08-06 MED ORDER — FENTANYL CITRATE (PF) 100 MCG/2ML IJ SOLN
25.0000 ug | INTRAMUSCULAR | Status: DC | PRN
  Administered 2023-08-06 (×4): 25 ug via INTRAVENOUS

## 2023-08-06 MED ORDER — PROPOFOL 500 MG/50ML IV EMUL
INTRAVENOUS | Status: DC | PRN
Start: 2023-08-06 — End: 2023-08-06
  Administered 2023-08-06: 150 ug/kg/min via INTRAVENOUS
  Administered 2023-08-06 (×3): 125 ug/kg/min via INTRAVENOUS

## 2023-08-06 MED ORDER — PROPOFOL 1000 MG/100ML IV EMUL
INTRAVENOUS | Status: AC
  Filled 2023-08-06: qty 100

## 2023-08-06 MED ORDER — SUCCINYLCHOLINE CHLORIDE 200 MG/10ML IV SOSY
PREFILLED_SYRINGE | INTRAVENOUS | Status: DC | PRN
  Administered 2023-08-06: 80 mg via INTRAVENOUS
  Administered 2023-08-06 (×2): 60 mg via INTRAVENOUS

## 2023-08-06 MED ORDER — MIDAZOLAM HCL 2 MG/2ML IJ SOLN
INTRAMUSCULAR | Status: DC | PRN
  Administered 2023-08-06: 2 mg via INTRAVENOUS

## 2023-08-06 MED ORDER — KETAMINE HCL 50 MG/5ML IJ SOSY
PREFILLED_SYRINGE | INTRAMUSCULAR | Status: AC
  Filled 2023-08-06: qty 5

## 2023-08-06 MED ORDER — KETOROLAC TROMETHAMINE 30 MG/ML IJ SOLN
INTRAMUSCULAR | Status: DC | PRN
  Administered 2023-08-06: 30 mg via INTRAVENOUS

## 2023-08-06 MED ORDER — BUPIVACAINE-EPINEPHRINE (PF) 0.5% -1:200000 IJ SOLN
INTRAMUSCULAR | Status: AC
  Filled 2023-08-06: qty 30

## 2023-08-06 MED ORDER — GLYCOPYRROLATE 0.2 MG/ML IJ SOLN
INTRAMUSCULAR | Status: AC
  Filled 2023-08-06: qty 1

## 2023-08-06 MED ORDER — KETAMINE HCL 50 MG/5ML IJ SOSY
PREFILLED_SYRINGE | INTRAMUSCULAR | Status: DC | PRN
  Administered 2023-08-06: 30 mg via INTRAVENOUS
  Administered 2023-08-06: 20 mg via INTRAVENOUS

## 2023-08-06 MED ORDER — SUGAMMADEX SODIUM 200 MG/2ML IV SOLN
INTRAVENOUS | Status: DC | PRN
  Administered 2023-08-06: 200 mg via INTRAVENOUS

## 2023-08-06 MED ORDER — ACETAMINOPHEN 500 MG PO TABS: 1000.0000 mg | ORAL_TABLET | Freq: Four times a day (QID) | ORAL | Status: AC | PRN

## 2023-08-06 MED ORDER — BUPIVACAINE LIPOSOME 1.3 % IJ SUSP
INTRAMUSCULAR | Status: AC
  Filled 2023-08-06: qty 20

## 2023-08-06 MED ORDER — MIDAZOLAM HCL 2 MG/2ML IJ SOLN
INTRAMUSCULAR | Status: AC
  Filled 2023-08-06: qty 2

## 2023-08-06 MED ORDER — ONDANSETRON HCL 4 MG/2ML IJ SOLN
INTRAMUSCULAR | Status: DC | PRN
  Administered 2023-08-06: 4 mg via INTRAVENOUS

## 2023-08-06 MED ORDER — HYDROMORPHONE HCL 1 MG/ML IJ SOLN
INTRAMUSCULAR | Status: DC | PRN
  Administered 2023-08-06 (×2): .5 mg via INTRAVENOUS

## 2023-08-06 MED ORDER — GABAPENTIN 300 MG PO CAPS
ORAL_CAPSULE | ORAL | Status: AC
  Filled 2023-08-06: qty 1

## 2023-08-06 MED ORDER — CHLORHEXIDINE GLUCONATE 0.12 % MT SOLN
OROMUCOSAL | Status: AC
  Filled 2023-08-06: qty 15

## 2023-08-06 MED ORDER — ACETAMINOPHEN 500 MG PO TABS
ORAL_TABLET | ORAL | Status: AC
  Filled 2023-08-06: qty 2

## 2023-08-06 MED ORDER — OXYCODONE HCL 5 MG PO TABS
5.0000 mg | ORAL_TABLET | Freq: Once | ORAL | Status: AC
  Administered 2023-08-06: 5 mg via ORAL

## 2023-08-06 MED ORDER — ROCURONIUM BROMIDE 100 MG/10ML IV SOLN
INTRAVENOUS | Status: DC | PRN
  Administered 2023-08-06: 60 mg via INTRAVENOUS
  Administered 2023-08-06: 40 mg via INTRAVENOUS

## 2023-08-06 MED ORDER — SODIUM CHLORIDE 0.9 % IV SOLN
INTRAVENOUS | Status: DC | PRN
  Administered 2023-08-06: 50 mL

## 2023-08-06 MED ORDER — CEFAZOLIN SODIUM-DEXTROSE 2-4 GM/100ML-% IV SOLN
INTRAVENOUS | Status: AC
  Filled 2023-08-06: qty 100

## 2023-08-06 MED ORDER — BUPIVACAINE-EPINEPHRINE 0.5% -1:200000 IJ SOLN
INTRAMUSCULAR | Status: DC | PRN
  Administered 2023-08-06: 30 mL

## 2023-08-06 MED ORDER — SODIUM CHLORIDE FLUSH 0.9 % IV SOLN
INTRAVENOUS | Status: AC
Start: 2023-08-06 — End: ?
  Filled 2023-08-06: qty 30

## 2023-08-06 MED ORDER — DEXAMETHASONE SODIUM PHOSPHATE 10 MG/ML IJ SOLN
INTRAMUSCULAR | Status: DC | PRN
  Administered 2023-08-06: 10 mg via INTRAVENOUS

## 2023-08-06 MED ORDER — DEXAMETHASONE SODIUM PHOSPHATE 10 MG/ML IJ SOLN
INTRAMUSCULAR | Status: AC
  Filled 2023-08-06: qty 1

## 2023-08-06 MED ORDER — LIDOCAINE HCL (PF) 2 % IJ SOLN
INTRAMUSCULAR | Status: AC
  Filled 2023-08-06: qty 5

## 2023-08-06 MED ORDER — DEXMEDETOMIDINE HCL IN NACL 80 MCG/20ML IV SOLN
INTRAVENOUS | Status: DC | PRN
  Administered 2023-08-06: 8 ug via INTRAVENOUS
  Administered 2023-08-06: 4 ug via INTRAVENOUS
  Administered 2023-08-06: 16 ug via INTRAVENOUS

## 2023-08-06 SURGICAL SUPPLY — 59 items
BINDER ABDOMINAL 12 ML 46-62 (SOFTGOODS) IMPLANT
BLADE SURG SZ11 CARB STEEL (BLADE) ×2 IMPLANT
CANNULA CAP OBTURATR AIRSEAL 8 (CAP) IMPLANT
COVER TIP SHEARS 8 DVNC (MISCELLANEOUS) ×2 IMPLANT
COVER WAND RF STERILE (DRAPES) ×2 IMPLANT
DERMABOND ADVANCED .7 DNX12 (GAUZE/BANDAGES/DRESSINGS) ×2 IMPLANT
DRAPE ARM DVNC X/XI (DISPOSABLE) ×6 IMPLANT
DRAPE COLUMN DVNC XI (DISPOSABLE) ×2 IMPLANT
ELECT CAUTERY BLADE TIP 2.5 (TIP) ×2 IMPLANT
ELECT REM PT RETURN 9FT ADLT (ELECTROSURGICAL) ×2 IMPLANT
ELECTRODE CAUTERY BLDE TIP 2.5 (TIP) ×2 IMPLANT
ELECTRODE REM PT RTRN 9FT ADLT (ELECTROSURGICAL) ×2 IMPLANT
FORCEPS BPLR R/ABLATION 8 DVNC (INSTRUMENTS) ×2 IMPLANT
GLOVE SURG SYN 7.0 (GLOVE) ×12 IMPLANT
GLOVE SURG SYN 7.0 PF PI (GLOVE) ×4 IMPLANT
GLOVE SURG SYN 7.5 E (GLOVE) ×12 IMPLANT
GLOVE SURG SYN 7.5 PF PI (GLOVE) ×4 IMPLANT
GOWN STRL REUS W/ TWL LRG LVL3 (GOWN DISPOSABLE) ×6 IMPLANT
GRASPER SUT TROCAR 14GX15 (MISCELLANEOUS) ×2 IMPLANT
IRRIGATION STRYKERFLOW (MISCELLANEOUS) IMPLANT
IRRIGATOR STRYKERFLOW (MISCELLANEOUS) IMPLANT
IV NS 1000ML BAXH (IV SOLUTION) IMPLANT
KIT PINK PAD W/HEAD ARE REST (MISCELLANEOUS) ×2 IMPLANT
KIT PINK PAD W/HEAD ARM REST (MISCELLANEOUS) ×2 IMPLANT
LABEL OR SOLS (LABEL) ×2 IMPLANT
MANIFOLD NEPTUNE II (INSTRUMENTS) ×2 IMPLANT
MESH VENT LT ST 15CM CRL ECHO2 (Mesh General) IMPLANT
MESH VENTRALIGHT ST 4.5 ECHO (Mesh General) IMPLANT
MESH VENTRLGHT ELLIPSE 8X6XMFL (Mesh Specialty) IMPLANT
NDL DRIVE SUT CUT DVNC (INSTRUMENTS) ×2 IMPLANT
NDL HYPO 22X1.5 SAFETY MO (MISCELLANEOUS) ×2 IMPLANT
NDL INSUFFLATION 14GA 120MM (NEEDLE) ×2 IMPLANT
NEEDLE DRIVE SUT CUT DVNC (INSTRUMENTS) ×2 IMPLANT
NEEDLE HYPO 22X1.5 SAFETY MO (MISCELLANEOUS) ×2 IMPLANT
NEEDLE INSUFFLATION 14GA 120MM (NEEDLE) ×2 IMPLANT
OBTURATOR OPTICAL STND 8 DVNC (TROCAR) ×2 IMPLANT
OBTURATOR OPTICALSTD 8 DVNC (TROCAR) ×2 IMPLANT
PACK LAP CHOLECYSTECTOMY (MISCELLANEOUS) ×2 IMPLANT
SCISSORS MNPLR CVD DVNC XI (INSTRUMENTS) ×2 IMPLANT
SEAL UNIV 5-12 XI (MISCELLANEOUS) ×4 IMPLANT
SET TUBE FILTERED XL AIRSEAL (SET/KITS/TRAYS/PACK) IMPLANT
SET TUBE SMOKE EVAC HIGH FLOW (TUBING) ×2 IMPLANT
SOL ELECTROSURG ANTI STICK (MISCELLANEOUS) ×2 IMPLANT
SOLUTION ELECTROSURG ANTI STCK (MISCELLANEOUS) ×2 IMPLANT
SPONGE T-LAP 18X18 ~~LOC~~+RFID (SPONGE) ×2 IMPLANT
SUT MNCRL 4-0 27 PS-2 XMFL (SUTURE) ×2 IMPLANT
SUT MNCRL 4-0 27XMFL (SUTURE) ×2
SUT STRATA 2-0 30 CT-2 (SUTURE) ×4 IMPLANT
SUT STRATAFIX PDS 30 CT-1 (SUTURE) ×2 IMPLANT
SUT VIC AB 3-0 SH 27X BRD (SUTURE) IMPLANT
SUT VICRYL 0 UR6 27IN ABS (SUTURE) ×4 IMPLANT
SUT VLOC 90 2/L VL 12 GS22 (SUTURE) IMPLANT
SUTURE MNCRL 4-0 27XMF (SUTURE) ×2 IMPLANT
SYS BAG RETRIEVAL 10MM (BASKET) ×2 IMPLANT
SYSTEM BAG RETRIEVAL 10MM (BASKET) IMPLANT
TAPE TRANSPORE STRL 2 31045 (GAUZE/BANDAGES/DRESSINGS) ×2 IMPLANT
TRAP FLUID SMOKE EVACUATOR (MISCELLANEOUS) ×2 IMPLANT
TRAY FOLEY SLVR 16FR LF STAT (SET/KITS/TRAYS/PACK) ×2 IMPLANT
WATER STERILE IRR 500ML POUR (IV SOLUTION) ×2 IMPLANT

## 2023-08-06 NOTE — Anesthesia Procedure Notes (Signed)
Procedure Name: Intubation Date/Time: 08/06/2023 7:37 AM  Performed by: Alethia Melendrez, [place], CRNAPre-anesthesia Checklist: Patient identified, Patient being monitored, Timeout performed, Emergency Drugs available and Suction available Patient Re-evaluated:Patient Re-evaluated prior to induction Oxygen Delivery Method: Circle system utilized Preoxygenation: Pre-oxygenation with 100% oxygen Induction Type: IV induction Ventilation: Mask ventilation without difficulty Laryngoscope Size: Mac, 3 and McGrath Grade View: Grade I Tube type: Oral Tube size: 7.0 mm Number of attempts: 1 Airway Equipment and Method: Stylet Placement Confirmation: ETT inserted through vocal cords under direct vision, positive ETCO2 and breath sounds checked- equal and bilateral Secured at: 21 cm Tube secured with: Tape Dental Injury: Teeth and Oropharynx as per pre-operative assessment

## 2023-08-06 NOTE — Discharge Instructions (Addendum)
Discharge Instructions: 1.  Patient may shower, but do not scrub wounds heavily and dab dry only. 2.  Do not submerge wounds in pool/tub until fully healed. 3.  Do not apply ointments or hydrogen peroxide to the wounds. 4.  May apply ice packs to the wounds for comfort. 5.  Please wear abdominal binder at all times for the next 4 weeks.  May remove for showers and for sleeping if too uncomfortable. 6.  Do not drive while taking narcotics for pain control.  Prior to driving, make sure you are able to rotate right and left to look at blindspots without significant pain or discomfort. 7.  No heavy lifting or pushing of more than 10-15 lbs for 6 weeks.   Information for Discharge Teaching:  Do NOT remove until 08/10/23 EXPAREL (bupivacaine liposome injectable suspension)   Pain relief is important to your recovery. The goal is to control your pain so you can move easier and return to your normal activities as soon as possible after your procedure. Your physician may use several types of medicines to manage pain, swelling, and more.  Your surgeon or anesthesiologist gave you EXPAREL(bupivacaine) to help control your pain after surgery.  EXPAREL is a local anesthetic designed to release slowly over an extended period of time to provide pain relief by numbing the tissue around the surgical site. EXPAREL is designed to release pain medication over time and can control pain for up to 72 hours. Depending on how you respond to EXPAREL, you may require less pain medication during your recovery. EXPAREL can help reduce or eliminate the need for opioids during the first few days after surgery when pain relief is needed the most. EXPAREL is not an opioid and is not addictive. It does not cause sleepiness or sedation.   Important! A teal colored band has been placed on your arm with the date, time and amount of EXPAREL you have received. Please leave this armband in place for the full 96 hours following  administration, and then you may remove the band. If you return to the hospital for any reason within 96 hours following the administration of EXPAREL, the armband provides important information that your health care providers to know, and alerts them that you have received this anesthetic.    Possible side effects of EXPAREL: Temporary loss of sensation or ability to move in the area where medication was injected. Nausea, vomiting, constipation Rarely, numbness and tingling in your mouth or lips, lightheadedness, or anxiety may occur. Call your doctor right away if you think you may be experiencing any of these sensations, or if you have other questions regarding possible side effects.  Follow all other discharge instructions given to you by your surgeon or nurse. Eat a healthy diet and drink plenty of water or other fluids.

## 2023-08-06 NOTE — Anesthesia Preprocedure Evaluation (Signed)
Anesthesia Evaluation  Patient identified by MRN, date of birth, ID band Patient awake    Reviewed: Allergy & Precautions, NPO status , Patient's Chart, lab work & pertinent test results  History of Anesthesia Complications Negative for: history of anesthetic complications  Airway Mallampati: II  TM Distance: >3 FB Neck ROM: Full    Dental  (+) Missing, Dental Advidsory Given, Poor Dentition   Pulmonary neg shortness of breath, asthma , neg sleep apnea, neg recent URI, Current Smoker and Patient abstained from smoking.   Pulmonary exam normal breath sounds clear to auscultation       Cardiovascular hypertension, (-) angina (-) Past MI and (-) CABG negative cardio ROS Normal cardiovascular exam(-) dysrhythmias  Rhythm:Regular Rate:Normal     Neuro/Psych  Headaches negative neurological ROS  negative psych ROS   GI/Hepatic negative GI ROS, Neg liver ROS,GERD  ,,  Endo/Other  negative endocrine ROS  Class 3 obesity  Renal/GU negative Renal ROS     Musculoskeletal   Abdominal   Peds  Hematology negative hematology ROS (+) Blood dyscrasia, anemia   Anesthesia Other Findings Past Medical History: No date: Anemia No date: Asthma No date: Family history of adverse reaction to anesthesia     Comment:  dad n/v No date: GERD (gastroesophageal reflux disease) No date: Headache     Comment:  migraines No date: Hypertension No date: Marijuana use No date: Meckel's diverticulum No date: Tobacco dependence  Past Surgical History: No date: CESAREAN SECTION 08/09/2022: HYSTERECTOMY ABDOMINAL WITH SALPINGECTOMY; Bilateral     Comment:  Procedure: HYSTERECTOMY ABDOMINAL WITH SALPINGECTOMY;                Surgeon: Schermerhorn, Ihor Austin, MD;  Location: [facility] ORS;              Service: Gynecology;  Laterality: Bilateral; No date: WISDOM TOOTH EXTRACTION  BMI    Body Mass Index: 40.55 kg/m       Reproductive/Obstetrics negative OB ROS                             Anesthesia Physical Anesthesia Plan  ASA: 3  Anesthesia Plan: General ETT   Post-op Pain Management: Dilaudid IV   Induction: Intravenous  PONV Risk Score and Plan: 2 and Ondansetron, Dexamethasone and Treatment may vary due to age or medical condition  Airway Management Planned: Oral ETT  Additional Equipment:   Intra-op Plan:   Post-operative Plan: Extubation in OR  Informed Consent: I have reviewed the patients History and Physical, chart, labs and discussed the procedure including the risks, benefits and alternatives for the proposed anesthesia with the patient or authorized representative who has indicated his/her understanding and acceptance.     Dental Advisory Given  Plan Discussed with: Anesthesiologist, CRNA and Surgeon  Anesthesia Plan Comments: (Patient consented for risks of anesthesia including but not limited to:  - adverse reactions to medications - damage to eyes, teeth, lips or other oral mucosa - nerve damage due to positioning  - sore throat or hoarseness - Damage to heart, brain, nerves, lungs, other parts of body or loss of life  Patient voiced understanding.)        Anesthesia Quick Evaluation

## 2023-08-06 NOTE — Transfer of Care (Signed)
Immediate Anesthesia Transfer of Care Note  Patient: Kelli Morrison  Procedure(s) Performed: XI ROBOTIC ASSISTED VENTRAL HERNIA, incisional INSERTION OF MESH (Abdomen)  Patient Location: PACU  Anesthesia Type:General  Level of Consciousness: awake and drowsy  Airway & Oxygen Therapy: Patient Spontanous Breathing and Patient connected to face mask oxygen  Post-op Assessment: Report given to RN and Post -op Vital signs reviewed and stable  Post vital signs: Reviewed and stable  Last Vitals:  Vitals Value Taken Time  BP 123/87 08/06/23 1053  Temp    Pulse 96 08/06/23 1059  Resp 19 08/06/23 1059  SpO2 98 % 08/06/23 1059  Vitals shown include unfiled device data.  Last Pain:  Vitals:   08/06/23 0619  TempSrc: Oral  PainSc: 0-No pain      Patients Stated Pain Goal: 0 (08/06/23 6213)  Complications: There were no known notable events for this encounter.

## 2023-08-06 NOTE — Op Note (Signed)
Procedure Date:  08/06/2023  Pre-operative Diagnosis:  Incisional epigastric hernia  Post-operative Diagnosis: Incisional epigastric hernia, recurrent umbilical hernia, total defect length 11.5 cm.  Procedure:  Robotic assisted Incional epigastric and recurrent umbilical Hernia Repair with mesh  Surgeon:  Howie Ill, MD  Anesthesia:  General endotracheal  Estimated Blood Loss:  20 ml  Specimens:  None  Complications:  None  Indications for Procedure:  This is a 37 y.o. female who presents with an incisional epigastric hernia.  She had a prior incisional inferior abdominal hernia and umbilical hernia repair.  The options of surgery versus observation were reviewed with the patient and/or family. The risks of bleeding, abscess or infection, recurrence of symptoms, potential for an open procedure, injury to surrounding structures, and chronic pain were all discussed with the patient and was willing to proceed.  Description of Procedure: The patient was correctly identified in the preoperative area and brought into the operating room.  The patient was placed supine with VTE prophylaxis in place.  Appropriate time-outs were performed.  Anesthesia was induced and the patient was intubated.  Appropriate antibiotics were infused.  The abdomen was prepped and draped in a sterile fashion. The patient's hernia defect was marked with a marking pen.  A Veress needle was introduced in the left upper quadrant and pneumoperitoneum was obtained with appropriate pressures.  Using Optiview technique, an 8 mm port was introduced in the left lateral abdominal wall without complications.  Then, a 12 mm port was introduced in the left upper quadrant and an 8 mm port in the left lower quadrant under direct visualization.  The DaVinci platform was docked, camera targeted, and instruments placed under direct visualization.  The patient was noted to have an incisional epigastric hernia and also a small  recurrent umbilical hernia.  Both hernias were fully reduced and the peritoneum and preperitoneal fat were dissected and resected to allow better exposure of the hernia defects and for better mesh placement.  The hernia defects were measured to be a total of 11.5 cm in combined length.  Two 0 Stratafix sutures were inserted and used to close the hernia defects.  Then, a 6 x 8 inch Bard Ventralight ST mesh was opened and cut to measure 10 x 18 cm.  This was inserted via the 12 mm port in addition to  four 2-0 V-loc sutures.  The mesh was opened and the needles were used to spread it and center it over the hernia defects.  The mesh was then sutured in place circumferentially and through the center of the mesh using the V-loc sutures.  All needles and the positioning system were then removed through the 12 mm port without complications.  The preperitoneal fat was placed in an Endocatch bag.  The DaVinci platform was then undocked and instruments removed.    80 ml of Exparel solution mixed with 0.5% bupivacaine with epi and saline was infiltrated around the mesh edges, hernia repair site, and port sites.  The 12 mm port was removed and the endocatch bag retrieved.  The fascia was closed under direct visualization utilizing an Endo Close technique with 0 Vicryl suture.  The 8 mm ports were removed. The 12 mm incision was closed using 3-0 Vicryl and 4-0 Monocryl, and the other port incisions were closed with 4-0 Monocryl.  The wounds were cleaned and sealed with DermaBond.  The patient was emerged from anesthesia and extubated and brought to the recovery room for further management.  The patient tolerated  the procedure well and all counts were correct at the end of the case.   Howie Ill, MD

## 2023-08-06 NOTE — Interval H&P Note (Signed)
History and Physical Interval Note:  08/06/2023 7:05 AM  Kelli Morrison Comp  has presented today for surgery, with the diagnosis of incisional hernia reducible 3 -10 cm.  The various methods of treatment have been discussed with the patient and family. After consideration of risks, benefits and other options for treatment, the patient has consented to  Procedure(s): XI ROBOTIC ASSISTED VENTRAL HERNIA, incisional (N/A) as a surgical intervention.  The patient's history has been reviewed, patient examined, no change in status, stable for surgery.  I have reviewed the patient's chart and labs.  Questions were answered to the patient's satisfaction.     Regina Ganci

## 2023-08-09 ENCOUNTER — Other Ambulatory Visit: Payer: Self-pay | Admitting: Surgery

## 2023-08-09 ENCOUNTER — Telehealth: Payer: Self-pay | Admitting: *Deleted

## 2023-08-09 MED ORDER — OXYCODONE HCL 5 MG PO TABS: 5.0000 mg | ORAL_TABLET | ORAL | 0 refills | Status: DC | PRN

## 2023-08-09 MED ORDER — CYCLOBENZAPRINE HCL 5 MG PO TABS: 5.0000 mg | ORAL_TABLET | Freq: Three times a day (TID) | ORAL | 0 refills | Status: AC | PRN

## 2023-08-09 NOTE — Telephone Encounter (Signed)
Patient called and stated that she had surgery on 08/06/23 by Dr Dorene Sorrow and is she requesting another refill on oxycodone ([facility] [place]). Please call and advise

## 2023-08-13 ENCOUNTER — Encounter: Payer: Self-pay | Admitting: Surgery

## 2023-08-16 ENCOUNTER — Encounter: Payer: Medicaid Other | Admitting: Surgery

## 2023-08-19 ENCOUNTER — Encounter: Payer: Self-pay | Admitting: Surgery

## 2023-08-21 ENCOUNTER — Encounter: Payer: Medicaid Other | Admitting: Surgery

## 2023-08-22 NOTE — Anesthesia Postprocedure Evaluation (Signed)
Anesthesia Post Note  Patient: Kelli Morrison  Procedure(s) Performed: XI ROBOTIC ASSISTED VENTRAL HERNIA, incisional INSERTION OF MESH (Abdomen)  Patient location during evaluation: PACU Anesthesia Type: General Level of consciousness: awake and alert Pain management: pain level controlled Vital Signs Assessment: post-procedure vital signs reviewed and stable Respiratory status: spontaneous breathing, nonlabored ventilation, respiratory function stable and patient connected to nasal cannula oxygen Cardiovascular status: blood pressure returned to baseline and stable Postop Assessment: no apparent nausea or vomiting Anesthetic complications: no   There were no known notable events for this encounter.   Last Vitals:  Vitals:   08/06/23 1200 08/06/23 1225  BP: 117/76 120/84  Pulse: 89 78  Resp: 11 14  Temp: (!) 36.3 C 36.4 C  SpO2: 94% 96%    Last Pain:  Vitals:   08/06/23 1225  TempSrc: Temporal  PainSc: 0-No pain                 Lenard Simmer

## 2023-08-29 ENCOUNTER — Encounter: Payer: Self-pay | Admitting: Gastroenterology

## 2023-09-02 ENCOUNTER — Encounter: Payer: Medicaid Other | Admitting: Surgery

## 2023-09-05 ENCOUNTER — Telehealth: Payer: Self-pay | Admitting: *Deleted

## 2023-09-05 NOTE — Telephone Encounter (Signed)
Faxed FMLA to [facility] at [phone]

## 2023-09-09 ENCOUNTER — Encounter: Payer: Self-pay | Admitting: Surgery

## 2023-09-09 ENCOUNTER — Ambulatory Visit (INDEPENDENT_AMBULATORY_CARE_PROVIDER_SITE_OTHER): Payer: BC Managed Care – PPO | Admitting: Surgery

## 2023-09-09 VITALS — BP 118/78 | HR 95 | Temp 98.2°F | Ht 67.5 in | Wt 242.0 lb

## 2023-09-09 DIAGNOSIS — K429 Umbilical hernia without obstruction or gangrene: Secondary | ICD-10-CM | POA: Diagnosis not present

## 2023-09-09 DIAGNOSIS — K432 Incisional hernia without obstruction or gangrene: Secondary | ICD-10-CM

## 2023-09-09 DIAGNOSIS — Z09 Encounter for follow-up examination after completed treatment for conditions other than malignant neoplasm: Secondary | ICD-10-CM | POA: Diagnosis not present

## 2023-09-09 NOTE — Patient Instructions (Signed)

## 2023-09-09 NOTE — Progress Notes (Signed)
 09/09/2023  History of Present Illness: Kelli Morrison is a 38 y.o. female status post robotic assisted incisional and recurrent umbilical hernia repair on 08/06/2023.  Patient presents today for follow-up.  The patient reported around mid December that she was having hives and itching over her abdomen and feels that this was related to the abdominal binder itself and this resolved after stopping the binder.  Never required any steroid taper for this.  Otherwise reports that she has been doing well and denies any abdominal pain, nausea, vomiting.  Denies any troubles with any bulging sensation.  She does report that the left lower quadrant incision sometimes rubs against her jeans but otherwise denies any troubles.  Past Medical History: Past Medical History:  Diagnosis Date   Anemia    Asthma    Family history of adverse reaction to anesthesia    dad n/v   GERD (gastroesophageal reflux disease)    Headache    migraines   Hypertension    Marijuana use    Meckel's diverticulum    Tobacco dependence      Past Surgical History: Past Surgical History:  Procedure Laterality Date   CESAREAN SECTION     HYSTERECTOMY ABDOMINAL WITH SALPINGECTOMY Bilateral 08/09/2022   Procedure: HYSTERECTOMY ABDOMINAL WITH SALPINGECTOMY;  Surgeon: Schermerhorn, Debby PARAS, MD;  Location: [facility];  Service: Gynecology;  Laterality: Bilateral;   INCISIONAL HERNIA REPAIR  12/13/2022   Procedure: HERNIA REPAIR INCISIONAL;  Surgeon: Desiderio Schanz, MD;  Location: [facility];  Service: General;;   INSERTION OF MESH N/A 08/06/2023   Procedure: INSERTION OF MESH;  Surgeon: Desiderio Schanz, MD;  Location: [facility];  Service: General;  Laterality: N/A;   UMBILICAL HERNIA REPAIR  12/13/2022   Procedure: HERNIA REPAIR UMBILICAL ADULT;  Surgeon: Desiderio Schanz, MD;  Location: [facility];  Service: General;;   WISDOM TOOTH EXTRACTION     XI ROBOTIC ASSISTED SMALL BOWEL RESECTION N/A 12/13/2022   Procedure: XI ROBOTIC ASSISTED  SMALL BOWEL RESECTION;  Surgeon: Desiderio Schanz, MD;  Location: [facility];  Service: General;  Laterality: N/A;   XI ROBOTIC ASSISTED VENTRAL HERNIA N/A 08/06/2023   Procedure: XI ROBOTIC ASSISTED VENTRAL HERNIA, incisional;  Surgeon: Desiderio Schanz, MD;  Location: [facility];  Service: General;  Laterality: N/A;    Home Medications: Prior to Admission medications   Medication Sig Start Date End Date Taking? Authorizing Provider  acetaminophen  (TYLENOL ) 500 MG tablet Take 2 tablets (1,000 mg total) by mouth every 6 (six) hours as needed for mild pain (pain score 1-3). 08/06/23  Yes Trinita Devlin, Schanz, MD  albuterol  (VENTOLIN  HFA) 108 (90 Base) MCG/ACT inhaler Inhale 2 puffs into the lungs every 6 (six) hours as needed. 12/11/21  Yes Rodriguez-Southworth, Sylvia, PA-C  ALPRAZolam  (XANAX ) 0.25 MG tablet Take 0.25 mg by mouth 2 (two) times daily as needed for anxiety.   Yes [provider]  atomoxetine (STRATTERA) 80 MG capsule Take 80 mg by mouth every morning. 07/01/23  Yes [provider]  b complex vitamins capsule Take 1 capsule by mouth daily.   Yes [provider]  budesonide-formoterol  (SYMBICORT) 160-4.5 MCG/ACT inhaler Inhale 2 puffs into the lungs 2 (two) times daily.   Yes [provider]  buPROPion (WELLBUTRIN XL) 300 MG 24 hr tablet Take 300 mg by mouth daily.   Yes [provider]  cetirizine (ZYRTEC) 10 MG tablet Take 10 mg by mouth at bedtime.   Yes [provider]  COSENTYX UNOREADY 300 MG/2ML SOAJ Inject 300 mg  into the skin every 28 (twenty-eight) days. 07/22/23  Yes [provider]  CRANBERRY PO Take 1 tablet by mouth daily.   Yes [provider]  cyclobenzaprine  (FLEXERIL ) 5 MG tablet Take 1 tablet (5 mg total) by mouth 3 (three) times daily as needed for muscle spasms. 08/09/23  Yes Dayja Loveridge, Aloysius, MD  diclofenac  (VOLTAREN ) 75 MG EC tablet Take 75 mg by mouth 2 (two) times daily as needed for moderate pain (pain score  4-6). 06/24/23  Yes [provider]  Fish Oil-Coenzyme Q10 (FISH OIL PLUS CO Q-10 PO) Take 1 capsule by mouth daily.   Yes [provider]  losartan -hydrochlorothiazide  (HYZAAR) 100-12.5 MG tablet Take 1 tablet by mouth every morning.   Yes [provider]  MAGNESIUM PO Take 1 tablet by mouth daily.   Yes [provider]  montelukast  (SINGULAIR ) 10 MG tablet Take 10 mg by mouth every morning.   Yes [provider]  ondansetron  (ZOFRAN ) 4 MG tablet Take 1 tablet (4 mg total) by mouth every 8 (eight) hours as needed for nausea or vomiting. 08/06/23 08/05/24 Yes Zolton Dowson, Aloysius, MD  pantoprazole  (PROTONIX ) 40 MG tablet Take 1 tablet (40 mg total) by mouth daily. 07/26/23  Yes Loreto Loescher, Aloysius, MD  SUMAtriptan (IMITREX) 25 MG tablet Take 25 mg by mouth every 2 (two) hours as needed for migraine.   Yes [provider]  triamcinolone ointment (KENALOG) 0.1 % Apply 1 Application topically 2 (two) times daily. 04/16/23  Yes [provider]  Vitamin D, Ergocalciferol, (DRISDOL) 1.25 MG (50000 UNIT) CAPS capsule Take 50,000 Units by mouth every 7 (seven) days.   Yes [provider]  vitamin E 180 MG (400 UNITS) capsule Take 400 Units by mouth daily.   Yes [provider]    Allergies: No Known Allergies  Review of Systems: Review of Systems  Constitutional:  Negative for chills and fever.  Respiratory:  Negative for shortness of breath.   Cardiovascular:  Negative for chest pain.  Gastrointestinal:  Negative for abdominal pain, nausea and vomiting.    Physical Exam BP 118/78   Pulse 95   Temp 98.2 F (36.8 C)   Ht 5' 7.5 (1.715 m)   Wt 242 lb (109.8 kg)   LMP 07/10/2022 (Exact Date)   SpO2 98%   BMI 37.34 kg/m  CONSTITUTIONAL: No acute distress HEENT:  Normocephalic, atraumatic, extraocular motion intact. RESPIRATORY:  Normal respiratory effort without pathologic use of accessory muscles. CARDIOVASCULAR: Regular  rhythm and rate. GI: The abdomen is soft, nondistended, nontender to palpation.  There is no evidence of any recurrent hernia.  Robotic incisions are healing well and are clean, dry, intact.  The left lower quadrant incision appears to have had mild dehiscence but the scab has already formed sealing the wound.  NEUROLOGIC:  Motor and sensation is grossly normal.  Cranial nerves are grossly intact. PSYCH:  Alert and oriented to person, place and time. Affect is normal.  Assessment and Plan: This is a 38 y.o. female status post robotic assisted incisional and recurrent umbilical hernia repair.  - Patient has been doing well after surgery and the episode of hives that have been about 3 weeks ago.  Everything has resolved since.  There is no evidence of any complications from the surgery particularly no hernia recurrence.  Recommended that she can apply a Band-Aid over the left lower quadrant incision to prevent any further friction and rubbing from the jeans onto the wound.  This will allow for  the wound to heal better.  Discussed with the patient activity restrictions which end next week. - Follow-up as needed  I spent 20 minutes dedicated to the care of this patient on the date of this encounter to include pre-visit review of records, face-to-face time with the patient discussing diagnosis and management, and any post-visit coordination of care.   Aloysius Sheree Plant, MD [facility]

## 2023-11-04 ENCOUNTER — Ambulatory Visit: Payer: BC Managed Care – PPO | Admitting: Gastroenterology

## 2023-11-04 NOTE — Progress Notes (Deleted)
 Wyline Mood MD, MRCP(U.K) [address]  [address]  [place], [place] [address]  Main: [phone]  Fax: [phone]   Gastroenterology Consultation  Referring Provider:     Ardyth Gal, MD Primary Care Physician:  Ardyth Gal, MD Primary Gastroenterologist:  Dr. Wyline Mood  Reason for Consultation:     GERD        HPI:   Kelli Morrison is a 38 y.o. y/o female referred for GERD.    H/o robotic small bowel resection for Meckel's diverticulum and repair of lower abdominal incisional hernia on 12/13/22. She developed a new incisional hernia at the 12 mm port site in epigastric location.08/06/23 robotic assisted incisional ventral hernia repair with mesh .    Referred to see me for GERD which didn't respond to nexium and was changed to protonix.   Reflux:  Onset : *** Symptoms: *** Recent weight gain: *** Medications: *** Narcotics or anticholinergics use : *** PPI /H2 blockers or Antacid  use and timing :*** Dinner time : ***  Prior EGD: *** Family history of esophageal cancer:***      Past Medical History:  Diagnosis Date   Anemia    Asthma    Family history of adverse reaction to anesthesia    dad n/v   GERD (gastroesophageal reflux disease)    Headache    migraines   Hypertension    Marijuana use    Meckel's diverticulum    Tobacco dependence     Past Surgical History:  Procedure Laterality Date   CESAREAN SECTION     HYSTERECTOMY ABDOMINAL WITH SALPINGECTOMY Bilateral 08/09/2022   Procedure: HYSTERECTOMY ABDOMINAL WITH SALPINGECTOMY;  Surgeon: Schermerhorn, Ihor Austin, MD;  Location: [facility];  Service: Gynecology;  Laterality: Bilateral;   INCISIONAL HERNIA REPAIR  12/13/2022   Procedure: HERNIA REPAIR INCISIONAL;  Surgeon: Henrene Dodge, MD;  Location: [facility];  Service: General;;   INSERTION OF MESH N/A 08/06/2023   Procedure: INSERTION OF MESH;  Surgeon: Henrene Dodge, MD;  Location: [facility];  Service: General;  Laterality: N/A;    UMBILICAL HERNIA REPAIR  12/13/2022   Procedure: HERNIA REPAIR UMBILICAL ADULT;  Surgeon: Henrene Dodge, MD;  Location: [facility];  Service: General;;   WISDOM TOOTH EXTRACTION     XI ROBOTIC ASSISTED SMALL BOWEL RESECTION N/A 12/13/2022   Procedure: XI ROBOTIC ASSISTED SMALL BOWEL RESECTION;  Surgeon: Henrene Dodge, MD;  Location: [facility];  Service: General;  Laterality: N/A;   XI ROBOTIC ASSISTED VENTRAL HERNIA N/A 08/06/2023   Procedure: XI ROBOTIC ASSISTED VENTRAL HERNIA, incisional;  Surgeon: Henrene Dodge, MD;  Location: [facility];  Service: General;  Laterality: N/A;    Prior to Admission medications   Medication Sig Start Date End Date Taking? Authorizing Provider  acetaminophen (TYLENOL) 500 MG tablet Take 2 tablets (1,000 mg total) by mouth every 6 (six) hours as needed for mild pain (pain score 1-3). 08/06/23   Piscoya, Elita Quick, MD  albuterol (VENTOLIN HFA) 108 (90 Base) MCG/ACT inhaler Inhale 2 puffs into the lungs every 6 (six) hours as needed. 12/11/21   Rodriguez-Southworth, Nettie Elm, PA-C  ALPRAZolam Prudy Feeler) 0.25 MG tablet Take 0.25 mg by mouth 2 (two) times daily as needed for anxiety.    [provider]  atomoxetine (STRATTERA) 80 MG capsule Take 80 mg by mouth every morning. 07/01/23   [provider]  b complex vitamins capsule Take 1 capsule by mouth daily.    [provider]  budesonide-formoterol (SYMBICORT) 160-4.5 MCG/ACT inhaler Inhale  2 puffs into the lungs 2 (two) times daily.    [provider]  buPROPion (WELLBUTRIN XL) 300 MG 24 hr tablet Take 300 mg by mouth daily.    [provider]  cetirizine (ZYRTEC) 10 MG tablet Take 10 mg by mouth at bedtime.    [provider]  COSENTYX UNOREADY 300 MG/2ML SOAJ Inject 300 mg into the skin every 28 (twenty-eight) days. 07/22/23   [provider]  CRANBERRY PO Take 1 tablet by mouth daily.    [provider]  cyclobenzaprine (FLEXERIL) 5 MG tablet Take 1 tablet (5  mg total) by mouth 3 (three) times daily as needed for muscle spasms. 08/09/23   Henrene Dodge, MD  diclofenac (VOLTAREN) 75 MG EC tablet Take 75 mg by mouth 2 (two) times daily as needed for moderate pain (pain score 4-6). 06/24/23   [provider]  Fish Oil-Coenzyme Q10 (FISH OIL PLUS CO Q-10 PO) Take 1 capsule by mouth daily.    [provider]  losartan-hydrochlorothiazide (HYZAAR) 100-12.5 MG tablet Take 1 tablet by mouth every morning.    [provider]  MAGNESIUM PO Take 1 tablet by mouth daily.    [provider]  montelukast (SINGULAIR) 10 MG tablet Take 10 mg by mouth every morning.    [provider]  ondansetron (ZOFRAN) 4 MG tablet Take 1 tablet (4 mg total) by mouth every 8 (eight) hours as needed for nausea or vomiting. 08/06/23 08/05/24  Henrene Dodge, MD  pantoprazole (PROTONIX) 40 MG tablet Take 1 tablet (40 mg total) by mouth daily. 07/26/23   Henrene Dodge, MD  SUMAtriptan (IMITREX) 25 MG tablet Take 25 mg by mouth every 2 (two) hours as needed for migraine.    [provider]  triamcinolone ointment (KENALOG) 0.1 % Apply 1 Application topically 2 (two) times daily. 04/16/23   [provider]  Vitamin D, Ergocalciferol, (DRISDOL) 1.25 MG (50000 UNIT) CAPS capsule Take 50,000 Units by mouth every 7 (seven) days.    [provider]  vitamin E 180 MG (400 UNITS) capsule Take 400 Units by mouth daily.    [provider]    Family History  Problem Relation Age of Onset   Hypertension Mother    Hypertension Father    Heart failure Maternal Grandfather      Social History   Tobacco Use   Smoking status: Every Day    Current packs/day: 0.50    Average packs/day: 0.5 packs/day for 22.0 years (11.0 ttl pk-yrs)    Types: Cigarettes    Passive exposure: Past   Smokeless tobacco: Never  Vaping Use   Vaping status: Never Used  Substance Use Topics   Alcohol use: Yes    Comment: liquor daily   Drug  use: Not Currently    Types: Marijuana    Comment: last use yesterday    Allergies as of 11/04/2023   (No Known Allergies)    Review of Systems:    All systems reviewed and negative except where noted in HPI.   Physical Exam:  LMP 07/10/2022 (Exact Date)  Patient's last menstrual period was 07/10/2022 (exact date). Psych:  Alert and cooperative. Normal mood and affect. General:   Alert,  Well-developed, well-nourished, pleasant and cooperative in NAD Head:  Normocephalic and atraumatic. Eyes:  Sclera clear, no icterus.   Conjunctiva pink. Ears:  Normal auditory acuity. Neck:  Supple; no masses or thyromegaly. Lungs:  Respirations even and unlabored.  Clear throughout to auscultation.  No wheezes, crackles, or rhonchi. No acute distress. Heart:  Regular rate and rhythm; no murmurs, clicks, rubs, or gallops. Abdomen:  Normal bowel sounds.  No bruits.  Soft, non-tender and non-distended without masses, hepatosplenomegaly or hernias noted.  No guarding or rebound tenderness.    Neurologic:  Alert and oriented x3;  grossly normal neurologically. Psych:  Alert and cooperative. Normal mood and affect.  Imaging Studies: No results found.  Assessment and Plan:   Kelli Morrison is a 38 y.o. y/o female has been referred for GERD.   Plan  GERD : Counseled on life style changes, suggest to use PPI first thing in the morning on empty stomach and eat 30 minutes after. Advised on the use of a wedge pillow at night , avoid meals for 2 hours prior to bed time. Weight loss ***.Discussed the risks and benefits of long term PPI use including but not limited to bone loss, chronic kidney disease, infections , low magnesium . Aim to use at the lowest dose for the shortest period of time    Follow up in ***  Dr Wyline Mood MD,MRCP(U.K)

## 2024-03-30 ENCOUNTER — Other Ambulatory Visit: Payer: Self-pay | Admitting: Surgery

## 2024-04-13 ENCOUNTER — Other Ambulatory Visit: Payer: Self-pay | Admitting: Medical Genetics

## 2024-04-28 ENCOUNTER — Other Ambulatory Visit

## 2024-05-08 ENCOUNTER — Other Ambulatory Visit
Admission: RE | Admit: 2024-05-08 | Discharge: 2024-05-08 | Disposition: A | Discharge status: Home / Self Care | Payer: Self-pay | Source: Ambulatory Visit | Attending: Medical Genetics | Admitting: Medical Genetics

## 2024-05-18 LAB — GENECONNECT MOLECULAR SCREEN: Genetic Analysis Overall Interpretation: NEGATIVE

## 2024-07-15 ENCOUNTER — Ambulatory Visit: Admission: EM | Admit: 2024-07-15 | Discharge: 2024-07-15 | Disposition: A | Discharge status: Home / Self Care

## 2024-07-15 DIAGNOSIS — I10 Essential (primary) hypertension: Secondary | ICD-10-CM | POA: Insufficient documentation

## 2024-07-15 DIAGNOSIS — R5383 Other fatigue: Secondary | ICD-10-CM | POA: Diagnosis present

## 2024-07-15 DIAGNOSIS — R197 Diarrhea, unspecified: Secondary | ICD-10-CM | POA: Diagnosis present

## 2024-07-15 DIAGNOSIS — R109 Unspecified abdominal pain: Secondary | ICD-10-CM | POA: Diagnosis present

## 2024-07-15 LAB — POCT URINE DIPSTICK
Bilirubin, UA: NEGATIVE
Blood, UA: NEGATIVE
Glucose, UA: NEGATIVE mg/dL
Leukocytes, UA: NEGATIVE
Nitrite, UA: NEGATIVE
Protein Ur, POC: NEGATIVE mg/dL
Spec Grav, UA: 1.02 (ref 1.010–1.025)
Urobilinogen, UA: 0.2 U/dL
pH, UA: 6 (ref 5.0–8.0)

## 2024-07-15 NOTE — ED Triage Notes (Signed)
 Patient states that she's had diarrhea for 9 days, patient states that she started with fatigue and back pain last night. Patient also states that she's having sinus pressure and a headache for 3 days

## 2024-07-15 NOTE — ED Provider Notes (Signed)
 [facility]    CSN: [id number] Arrival date & time: 07/15/24  1210      History   Chief Complaint Chief Complaint  Patient presents with   Diarrhea   Facial Pain   Headache    HPI Tensley Wery Drumheller is a 38 y.o. female with history of asthma, anemia, anxiety, ADHD, lumbar radiculopathy, GERD, migraines, hypertension, and tobacco abuse.  Today patient presents for 1.5-week history of abdominal cramping and diarrhea; 6-7 episodes per day.  No fever, nausea/vomiting. Reports reduced appetite and decreased PO intake. Patient reports history of diarrhea and constipation  but says having this many episodes daily for 9 days is out of the ordinary. No sick contacts, recent antibiotics, or recent travel. Has been taking Pepto Bismol.   Reports increased sinus pressure and headaches over the past 4 days and onset of increased fatigue and lower back pain last night. Reports nasal congestion. Denies sore throat and cough. Patient believes she may have a kidney infection,  but denies dysuria, urgency, frequency, and hematuria.  HPI  Past Medical History:  Diagnosis Date   Anemia    Asthma    Family history of adverse reaction to anesthesia    dad n/v   GERD (gastroesophageal reflux disease)    Headache    migraines   Hypertension    Marijuana use    Meckel's diverticulum    Tobacco dependence     Patient Active Problem List   Diagnosis Date Noted   Recurrent umbilical hernia 08/06/2023   Meckel's diverticulum 12/13/2022   Incisional hernia, without obstruction or gangrene 12/13/2022   Umbilical hernia without obstruction and without gangrene 12/13/2022   Menorrhagia 08/09/2022    Past Surgical History:  Procedure Laterality Date   CESAREAN SECTION     HYSTERECTOMY ABDOMINAL WITH SALPINGECTOMY Bilateral 08/09/2022   Procedure: HYSTERECTOMY ABDOMINAL WITH SALPINGECTOMY;  Surgeon: Lovetta Debby PARAS, MD;  Location: [facility];  Service: Gynecology;  Laterality:  Bilateral;   INCISIONAL HERNIA REPAIR  12/13/2022   Procedure: HERNIA REPAIR INCISIONAL;  Surgeon: Desiderio Schanz, MD;  Location: [facility];  Service: General;;   INSERTION OF MESH N/A 08/06/2023   Procedure: INSERTION OF MESH;  Surgeon: Desiderio Schanz, MD;  Location: [facility];  Service: General;  Laterality: N/A;   UMBILICAL HERNIA REPAIR  12/13/2022   Procedure: HERNIA REPAIR UMBILICAL ADULT;  Surgeon: Desiderio Schanz, MD;  Location: [facility];  Service: General;;   WISDOM TOOTH EXTRACTION     XI ROBOTIC ASSISTED SMALL BOWEL RESECTION N/A 12/13/2022   Procedure: XI ROBOTIC ASSISTED SMALL BOWEL RESECTION;  Surgeon: Desiderio Schanz, MD;  Location: [facility];  Service: General;  Laterality: N/A;   XI ROBOTIC ASSISTED VENTRAL HERNIA N/A 08/06/2023   Procedure: XI ROBOTIC ASSISTED VENTRAL HERNIA, incisional;  Surgeon: Desiderio Schanz, MD;  Location: [facility];  Service: General;  Laterality: N/A;    OB History   No obstetric history on file.      Home Medications    Prior to Admission medications   Medication Sig Start Date End Date Taking? Authorizing Provider  amphetamine-dextroamphetamine (ADDERALL) 10 MG tablet TAKE 1 TABLET BY MOUTH AT NOON AS NEEDED FOR ADHD   Yes [provider]  buPROPion (WELLBUTRIN XL) 300 MG 24 hr tablet Take 300 mg by mouth daily.   Yes [provider]  diclofenac  (VOLTAREN ) 75 MG EC tablet Take 75 mg by mouth 2 (two) times daily as needed for moderate pain (pain score 4-6). 06/24/23  Yes [provider]  losartan -hydrochlorothiazide  (HYZAAR) 100-12.5 MG tablet Take 1 tablet by mouth every morning.   Yes [provider]  montelukast  (SINGULAIR ) 10 MG tablet Take 10 mg by mouth every morning.   Yes [provider]  sertraline (ZOLOFT) 100 MG tablet TAKE 1 AND 1/2 TABLETS BY MOUTH EVERY DAY FOR DEPRESSION OR ANXIETY   Yes [provider]  SUMAtriptan (IMITREX) 25 MG tablet Take 25 mg by mouth every 2 (two) hours as needed for  migraine.   Yes [provider]  acetaminophen  (TYLENOL ) 500 MG tablet Take 2 tablets (1,000 mg total) by mouth every 6 (six) hours as needed for mild pain (pain score 1-3). 08/06/23   Desiderio Schanz, MD  albuterol  (VENTOLIN  HFA) 108 (90 Base) MCG/ACT inhaler Inhale 2 puffs into the lungs every 6 (six) hours as needed. 12/11/21   Rodriguez-Southworth, Sylvia, PA-C  ALPRAZolam  (XANAX ) 0.25 MG tablet Take 0.25 mg by mouth 2 (two) times daily as needed for anxiety.    [provider]  atomoxetine (STRATTERA) 80 MG capsule Take 80 mg by mouth every morning. 07/01/23   [provider]  b complex vitamins capsule Take 1 capsule by mouth daily.    [provider]  budesonide-formoterol  (SYMBICORT) 160-4.5 MCG/ACT inhaler Inhale 2 puffs into the lungs 2 (two) times daily.    [provider]  cetirizine (ZYRTEC) 10 MG tablet Take 10 mg by mouth at bedtime.    [provider]  COSENTYX UNOREADY 300 MG/2ML SOAJ Inject 300 mg into the skin every 28 (twenty-eight) days. 07/22/23   [provider]  CRANBERRY PO Take 1 tablet by mouth daily.    [provider]  cyclobenzaprine  (FLEXERIL ) 5 MG tablet Take 1 tablet (5 mg total) by mouth 3 (three) times daily as needed for muscle spasms. 08/09/23   Desiderio Schanz, MD  Fish Oil-Coenzyme Q10 (FISH OIL PLUS CO Q-10 PO) Take 1 capsule by mouth daily.    [provider]  FLUoxetine  (PROZAC ) 20 MG capsule     [provider]  MAGNESIUM PO Take 1 tablet by mouth daily.    [provider]  ondansetron  (ZOFRAN ) 4 MG tablet Take 1 tablet (4 mg total) by mouth every 8 (eight) hours as needed for nausea or vomiting. 08/06/23 08/05/24  Desiderio Schanz, MD  pantoprazole  (PROTONIX ) 40 MG tablet Take 1 tablet (40 mg total) by mouth daily. 07/26/23   Desiderio Schanz, MD  triamcinolone ointment (KENALOG) 0.1 % Apply 1 Application topically 2 (two) times daily. 04/16/23   [provider]   Vitamin D, Ergocalciferol, (DRISDOL) 1.25 MG (50000 UNIT) CAPS capsule Take 50,000 Units by mouth every 7 (seven) days.    [provider]  vitamin E 180 MG (400 UNITS) capsule Take 400 Units by mouth daily.    [provider]    Family History Family History  Problem Relation Age of Onset   Hypertension Mother    Hypertension Father    Heart failure Maternal Grandfather     Social History Social History   Tobacco Use   Smoking status: Every Day    Current packs/day: 0.50    Average packs/day: 0.5 packs/day for 22.0 years (11.0 ttl pk-yrs)    Types: Cigarettes    Passive exposure: Past   Smokeless tobacco: Never  Vaping Use   Vaping status: Never Used  Substance Use Topics   Alcohol use: Yes    Comment: liquor daily   Drug use: Not Currently  Types: Marijuana    Comment: last use yesterday     Allergies   Patient has no known allergies.   Review of Systems Review of Systems  Constitutional:  Positive for fatigue. Negative for chills, diaphoresis and fever.  HENT:  Positive for congestion, rhinorrhea and sinus pressure. Negative for ear pain and sore throat.   Respiratory:  Negative for cough and shortness of breath.   Cardiovascular:  Negative for chest pain.  Gastrointestinal:  Positive for abdominal pain and diarrhea. Negative for blood in stool, constipation and vomiting.  Genitourinary:  Negative for difficulty urinating, dysuria and frequency.  Musculoskeletal:  Positive for back pain and myalgias.  Skin:  Negative for rash.  Neurological:  Positive for headaches. Negative for dizziness.  Hematological:  Negative for adenopathy.     Physical Exam Triage Vital Signs ED Triage Vitals  Encounter Vitals Group     BP 07/15/24 1236 (!) 148/88     Girls Systolic BP Percentile --      Girls Diastolic BP Percentile --      Boys Systolic BP Percentile --      Boys Diastolic BP Percentile --      Pulse Rate 07/15/24 1236 85     Resp  07/15/24 1236 18     Temp 07/15/24 1236 98.4 F (36.9 C)     Temp Source 07/15/24 1236 Oral     SpO2 07/15/24 1236 97 %     Weight 07/15/24 1235 220 lb (99.8 kg)     Height --      Head Circumference --      Peak Flow --      Pain Score 07/15/24 1234 4     Pain Loc --      Pain Education --      Exclude from Growth Chart --    No data found.  Updated Vital Signs BP (!) 148/88 (BP Location: Left Arm)   Pulse 85   Temp 98.4 F (36.9 C) (Oral)   Resp 18   Wt 220 lb (99.8 kg)   LMP 07/10/2022 (Exact Date)   SpO2 97%   BMI 33.95 kg/m     Physical Exam Vitals and nursing note reviewed.  Constitutional:      General: She is not in acute distress.    Appearance: Normal appearance. She is not ill-appearing or toxic-appearing.  HENT:     Head: Normocephalic and atraumatic.     Right Ear: Tympanic membrane, ear canal and external ear normal.     Left Ear: Tympanic membrane, ear canal and external ear normal.     Nose: Congestion (mild) present.     Mouth/Throat:     Mouth: Mucous membranes are moist.     Pharynx: Oropharynx is clear.  Eyes:     General: No scleral icterus.       Right eye: No discharge.        Left eye: No discharge.     Conjunctiva/sclera: Conjunctivae normal.  Cardiovascular:     Rate and Rhythm: Normal rate and regular rhythm.     Heart sounds: Normal heart sounds.  Pulmonary:     Effort: Pulmonary effort is normal. No respiratory distress.     Breath sounds: Normal breath sounds.  Abdominal:     Palpations: Abdomen is soft.     Tenderness: There is abdominal tenderness (generalized). There is no right CVA tenderness or left CVA tenderness.  Musculoskeletal:     Cervical back: Neck supple.  Skin:  General: Skin is dry.  Neurological:     General: No focal deficit present.     Mental Status: She is alert. Mental status is at baseline.     Motor: No weakness.     Gait: Gait normal.  Psychiatric:        Mood and Affect: Mood normal.         Behavior: Behavior normal.      UC Treatments / Results  Labs (all labs ordered are listed, but only abnormal results are displayed) Labs Reviewed  POCT URINE DIPSTICK - Abnormal; Notable for the following components:      Result Value   Ketones, POC UA trace (5) (*)    All other components within normal limits    EKG   Radiology No results found.  Procedures Procedures (including critical care time)  Medications Ordered in UC Medications - No data to display  Initial Impression / Assessment and Plan / UC Course  I have reviewed the triage vital signs and the nursing notes.  Pertinent labs & imaging results that were available during my care of the patient were reviewed by me and considered in my medical decision making (see chart for details).   38 year old female presents for 9-day history of abdominal cramping and diarrhea, 6-7 episodes a day.  Over the past 4 days she has had fatigue, body aches, congestion and sinus pressure.  No fevers or breathing difficulty.  She is afebrile and overall well-appearing.  No acute distress.  On exam has mild nasal congestion.  Throat is clear.  Chest clear.  Heart regular rate and rhythm.  Abdomen soft with generalized tenderness without guarding or rebound.  Urinalysis today shows some ketones, otherwise clear and normal.  Reviewed results with patient.  Will have patient return with stool samples for GI by PCR and C. difficile testing.  In the meantime encouraged her to increase fluids/electrolytes and rest.  Will treat as needed based on stool sample results.  Bland diet.  Advised Pepto-Bismol and Tylenol  over-the-counter as needed but do not take Imodium until we have the results of the stool sample tests.  Advised going to ED if fever or worsening abdominal pain, weakness, fatigue or dehydration.  Otherwise, encouraged her to make a PCP follow-up appointment.  Continue antihypertensive medications as blood pressure was elevated  today at 148/88.  Work note was provided.   Final Clinical Impressions(s) / UC Diagnoses   Final diagnoses:  Other fatigue  Diarrhea, unspecified type  Abdominal cramping     Discharge Instructions      ABDOMINAL PAIN: You may take Tylenol  for pain relief. Use medications as directed including antiemetics and antidiarrheal medications if suggested or prescribed. You should increase fluids and electrolytes as well as rest over these next several days. If you have any questions or concerns, or if your symptoms are not improving or if especially if they acutely worsen, please call or stop back to the clinic immediately and we will be happy to help you or go to the ER   ABDOMINAL PAIN RED FLAGS: Seek immediate further care if: symptoms remain the same or worsen over the next 3-7 days, you are unable to keep fluids down, you see blood or mucus in your stool, you vomit black or dark red material, you have a fever of 101.F or higher, you have localized and/or persistent abdominal pain       ED Prescriptions   None    PDMP not reviewed this encounter.  Arvis Jolan NOVAK, PA-C 07/15/24 1349

## 2024-07-15 NOTE — Discharge Instructions (Signed)

## 2024-07-16 ENCOUNTER — Ambulatory Visit: Payer: Self-pay | Admitting: Physician Assistant

## 2024-07-16 ENCOUNTER — Other Ambulatory Visit
Admission: RE | Admit: 2024-07-16 | Discharge: 2024-07-16 | Disposition: A | Source: Ambulatory Visit | Attending: Internal Medicine | Admitting: Internal Medicine

## 2024-07-16 ENCOUNTER — Ambulatory Visit

## 2024-07-16 LAB — GASTROINTESTINAL PANEL BY PCR, STOOL (REPLACES STOOL CULTURE)

## 2024-07-16 LAB — C DIFFICILE QUICK SCREEN W PCR REFLEX
C Diff antigen: NEGATIVE
C Diff interpretation: NOT DETECTED
C Diff toxin: NEGATIVE

## 2024-08-12 NOTE — Progress Notes (Signed)
 Diagnosis is diarrhea

## 2024-08-19 ENCOUNTER — Encounter

## 2024-08-19 DIAGNOSIS — G8929 Other chronic pain: Secondary | ICD-10-CM | POA: Diagnosis not present

## 2024-08-19 DIAGNOSIS — R194 Change in bowel habit: Secondary | ICD-10-CM | POA: Diagnosis not present

## 2024-08-19 DIAGNOSIS — R933 Abnormal findings on diagnostic imaging of other parts of digestive tract: Secondary | ICD-10-CM | POA: Diagnosis present

## 2024-08-19 DIAGNOSIS — R109 Unspecified abdominal pain: Secondary | ICD-10-CM | POA: Diagnosis not present

## 2024-09-09 ENCOUNTER — Encounter: Payer: Self-pay | Admitting: Cardiovascular Disease
